# Patient Record
Sex: Male | Born: 1960 | Race: Black or African American | Hispanic: No | Smoking: Current some day smoker
Health system: Southern US, Community
[De-identification: ages and names within clinical notes are randomized; demographics above are authoritative.]

## PROBLEM LIST (undated history)

## (undated) DIAGNOSIS — C801 Malignant (primary) neoplasm, unspecified: Secondary | ICD-10-CM

## (undated) DIAGNOSIS — I10 Essential (primary) hypertension: Secondary | ICD-10-CM

## (undated) HISTORY — PX: ROTATOR CUFF REPAIR: SHX139

---

## 2018-06-07 ENCOUNTER — Other Ambulatory Visit: Payer: Self-pay

## 2018-06-07 ENCOUNTER — Emergency Department: Payer: PRIVATE HEALTH INSURANCE

## 2018-06-07 ENCOUNTER — Observation Stay
Admission: EM | Admit: 2018-06-07 | Discharge: 2018-06-09 | Disposition: A | Discharge status: Home / Self Care | Payer: PRIVATE HEALTH INSURANCE | Attending: Specialist | Admitting: Specialist

## 2018-06-07 DIAGNOSIS — R200 Anesthesia of skin: Secondary | ICD-10-CM | POA: Diagnosis not present

## 2018-06-07 DIAGNOSIS — Z880 Allergy status to penicillin: Secondary | ICD-10-CM | POA: Diagnosis not present

## 2018-06-07 DIAGNOSIS — I160 Hypertensive urgency: Secondary | ICD-10-CM | POA: Insufficient documentation

## 2018-06-07 DIAGNOSIS — S2232XA Fracture of one rib, left side, initial encounter for closed fracture: Secondary | ICD-10-CM | POA: Diagnosis not present

## 2018-06-07 DIAGNOSIS — Z8249 Family history of ischemic heart disease and other diseases of the circulatory system: Secondary | ICD-10-CM | POA: Diagnosis not present

## 2018-06-07 DIAGNOSIS — Z716 Tobacco abuse counseling: Secondary | ICD-10-CM | POA: Insufficient documentation

## 2018-06-07 DIAGNOSIS — F17210 Nicotine dependence, cigarettes, uncomplicated: Secondary | ICD-10-CM | POA: Diagnosis not present

## 2018-06-07 DIAGNOSIS — M79601 Pain in right arm: Secondary | ICD-10-CM | POA: Diagnosis not present

## 2018-06-07 DIAGNOSIS — R0789 Other chest pain: Principal | ICD-10-CM | POA: Insufficient documentation

## 2018-06-07 DIAGNOSIS — X58XXXA Exposure to other specified factors, initial encounter: Secondary | ICD-10-CM | POA: Diagnosis not present

## 2018-06-07 DIAGNOSIS — R911 Solitary pulmonary nodule: Secondary | ICD-10-CM | POA: Insufficient documentation

## 2018-06-07 DIAGNOSIS — I10 Essential (primary) hypertension: Secondary | ICD-10-CM | POA: Insufficient documentation

## 2018-06-07 DIAGNOSIS — J439 Emphysema, unspecified: Secondary | ICD-10-CM | POA: Diagnosis not present

## 2018-06-07 DIAGNOSIS — R079 Chest pain, unspecified: Secondary | ICD-10-CM | POA: Diagnosis present

## 2018-06-07 DIAGNOSIS — F1010 Alcohol abuse, uncomplicated: Secondary | ICD-10-CM | POA: Insufficient documentation

## 2018-06-07 HISTORY — DX: Essential (primary) hypertension: I10

## 2018-06-07 HISTORY — DX: Malignant (primary) neoplasm, unspecified: C80.1

## 2018-06-07 LAB — COMPREHENSIVE METABOLIC PANEL
ALT: 44 U/L (ref 0–44)
AST: 44 U/L — ABNORMAL HIGH (ref 15–41)
Albumin: 4 g/dL (ref 3.5–5.0)
Alkaline Phosphatase: 89 U/L (ref 38–126)
Anion gap: 10 (ref 5–15)
BUN: 21 mg/dL — ABNORMAL HIGH (ref 6–20)
CO2: 23 mmol/L (ref 22–32)
Calcium: 8.8 mg/dL — ABNORMAL LOW (ref 8.9–10.3)
Chloride: 107 mmol/L (ref 98–111)
Creatinine, Ser: 0.83 mg/dL (ref 0.61–1.24)
GFR calc Af Amer: >60 mL/min (ref >60)
GFR calc non Af Amer: >60 mL/min (ref >60)
Glucose, Bld: 93 mg/dL (ref 70–99)
Potassium: 4.4 mmol/L (ref 3.5–5.1)
Sodium: 140 mmol/L (ref 135–145)
Total Bilirubin: 0.7 mg/dL (ref 0.3–1.2)
Total Protein: 7.3 g/dL (ref 6.5–8.1)

## 2018-06-07 LAB — APTT: aPTT: 29 seconds (ref 24–36)

## 2018-06-07 LAB — CBC WITH DIFFERENTIAL/PLATELET
Abs Immature Granulocytes: 0.02 10*3/uL (ref 0.00–0.07)
Basophils Absolute: 0 10*3/uL (ref 0.0–0.1)
Basophils Relative: 0 %
Eosinophils Absolute: 0 10*3/uL (ref 0.0–0.5)
Eosinophils Relative: 1 %
HCT: 41.3 % (ref 39.0–52.0)
Hemoglobin: 14.2 g/dL (ref 13.0–17.0)
Immature Granulocytes: 0 %
Lymphocytes Relative: 45 %
Lymphs Abs: 2.9 10*3/uL (ref 0.7–4.0)
MCH: 32.3 pg (ref 26.0–34.0)
MCHC: 34.4 g/dL (ref 30.0–36.0)
MCV: 94.1 fL (ref 80.0–100.0)
Monocytes Absolute: 0.7 10*3/uL (ref 0.1–1.0)
Monocytes Relative: 10 %
Neutro Abs: 2.9 10*3/uL (ref 1.7–7.7)
Neutrophils Relative %: 44 %
Platelets: 288 10*3/uL (ref 150–400)
RBC: 4.39 MIL/uL (ref 4.22–5.81)
RDW: 14.9 % (ref 11.5–15.5)
WBC: 6.5 10*3/uL (ref 4.0–10.5)
nRBC: 0 % (ref 0.0–0.2)

## 2018-06-07 LAB — TROPONIN I
Troponin I: <0.03 ng/mL (ref <0.03)
Troponin I: 0.83 ng/mL (ref <0.03)
Troponin I: 0.82 ng/mL (ref <0.03)

## 2018-06-07 LAB — PROTIME-INR
INR: 1 (ref 0.8–1.2)
Prothrombin Time: 13.2 seconds (ref 11.4–15.2)

## 2018-06-07 LAB — LIPASE, BLOOD: Lipase: 35 U/L (ref 11–51)

## 2018-06-07 LAB — ETHANOL: Alcohol, Ethyl (B): <10 mg/dL (ref <10)

## 2018-06-07 MED ORDER — ADULT MULTIVITAMIN W/MINERALS CH
1.0000 | ORAL_TABLET | Freq: Every day | ORAL | Status: DC
  Administered 2018-06-07 – 2018-06-09 (×3): 1 via ORAL
  Filled 2018-06-07 (×3): qty 1

## 2018-06-07 MED ORDER — HEPARIN (PORCINE) 25000 UT/250ML-% IV SOLN
900.0000 [IU]/h | INTRAVENOUS | Status: DC
  Administered 2018-06-07: 700 [IU]/h via INTRAVENOUS
  Filled 2018-06-07: qty 250

## 2018-06-07 MED ORDER — NITROGLYCERIN 0.4 MG SL SUBL: 0.4000 mg | SUBLINGUAL_TABLET | SUBLINGUAL | Status: DC | PRN

## 2018-06-07 MED ORDER — HEPARIN BOLUS VIA INFUSION
3700.0000 [IU] | Freq: Once | INTRAVENOUS | Status: AC
  Administered 2018-06-07: 3700 [IU] via INTRAVENOUS
  Filled 2018-06-07: qty 3700

## 2018-06-07 MED ORDER — SODIUM CHLORIDE 0.9% FLUSH
3.0000 mL | Freq: Two times a day (BID) | INTRAVENOUS | Status: DC
  Administered 2018-06-07 – 2018-06-09 (×3): 3 mL via INTRAVENOUS

## 2018-06-07 MED ORDER — VITAMIN B-1 100 MG PO TABS
100.0000 mg | ORAL_TABLET | Freq: Every day | ORAL | Status: DC
  Administered 2018-06-07 – 2018-06-09 (×3): 100 mg via ORAL
  Filled 2018-06-07 (×3): qty 1

## 2018-06-07 MED ORDER — HYDRALAZINE HCL 20 MG/ML IJ SOLN
10.0000 mg | Freq: Four times a day (QID) | INTRAMUSCULAR | Status: DC | PRN
  Administered 2018-06-07 – 2018-06-08 (×2): 10 mg via INTRAVENOUS
  Filled 2018-06-07 (×2): qty 1

## 2018-06-07 MED ORDER — FOLIC ACID 1 MG PO TABS
1.0000 mg | ORAL_TABLET | Freq: Every day | ORAL | Status: DC
  Administered 2018-06-07 – 2018-06-09 (×3): 1 mg via ORAL
  Filled 2018-06-07 (×3): qty 1

## 2018-06-07 MED ORDER — ASPIRIN EC 81 MG PO TBEC
81.0000 mg | DELAYED_RELEASE_TABLET | Freq: Every day | ORAL | Status: DC
  Administered 2018-06-08 – 2018-06-09 (×2): 81 mg via ORAL
  Filled 2018-06-07 (×2): qty 1

## 2018-06-07 MED ORDER — ENOXAPARIN SODIUM 40 MG/0.4ML ~~LOC~~ SOLN: 40.0000 mg | SUBCUTANEOUS | Status: DC

## 2018-06-07 MED ORDER — HYDROCHLOROTHIAZIDE 12.5 MG PO CAPS
12.5000 mg | ORAL_CAPSULE | Freq: Every day | ORAL | Status: DC
  Administered 2018-06-07 – 2018-06-09 (×3): 12.5 mg via ORAL
  Filled 2018-06-07 (×3): qty 1

## 2018-06-07 MED ORDER — SODIUM CHLORIDE 0.9% FLUSH: 3.0000 mL | INTRAVENOUS | Status: DC | PRN

## 2018-06-07 MED ORDER — IOPAMIDOL (ISOVUE-370) INJECTION 76%
100.0000 mL | Freq: Once | INTRAVENOUS | Status: AC | PRN
  Administered 2018-06-07: 100 mL via INTRAVENOUS

## 2018-06-07 MED ORDER — IOHEXOL 350 MG/ML SOLN: 100.0000 mL | Freq: Once | INTRAVENOUS | Status: DC | PRN

## 2018-06-07 MED ORDER — LORAZEPAM 2 MG/ML IJ SOLN
1.0000 mg | Freq: Once | INTRAMUSCULAR | Status: AC
  Administered 2018-06-07: 1 mg via INTRAVENOUS
  Filled 2018-06-07: qty 1

## 2018-06-07 MED ORDER — AMLODIPINE BESYLATE 5 MG PO TABS
5.0000 mg | ORAL_TABLET | Freq: Every day | ORAL | Status: DC
  Administered 2018-06-07 – 2018-06-09 (×3): 5 mg via ORAL
  Filled 2018-06-07 (×3): qty 1

## 2018-06-07 NOTE — Consult Note (Signed)
[place] for Heparin dosing  Indication: chest pain/ACS   Allergies  Allergen Reactions  . Penicillins Hives    Did it involve swelling of the face/tongue/throat, SOB, or low BP? No Did it involve sudden or severe rash/hives, skin peeling, or any reaction on the inside of your mouth or nose? No Did you need to seek medical attention at a hospital or doctor's office? No When did it last happen? If all above answers are "NO", may proceed with cephalosporin use.     Patient Measurements: Height: 5\' 8"  (172.7 cm) Weight: 136 lb 12.8 oz (62.1 kg) IBW/kg (Calculated) : 68.4 Heparin Dosing Weight: 62.1  Kg   Vital Signs: Temp: 98.5 F (36.9 C) (05/02 1939) Temp Source: Oral (05/02 1939) BP: 178/104 (05/02 1939) Pulse Rate: 83 (05/02 1939)  Labs: Recent Labs    06/07/18 1306 06/07/18 1816 06/07/18 1926  HGB 14.2  --   --   HCT 41.3  --   --   PLT 288  --   --   APTT  --   --  29  LABPROT 13.2  --   --   INR 1.0  --   --   CREATININE 0.83  --   --   TROPONINI <0.03 0.83*  --     Estimated Creatinine Clearance: 85.2 mL/min (by C-G formula based on SCr of 0.83 mg/dL).   Medications:  Med reconciliation completed and no meds recorded. Enoxaparin was ordered, but was not been given.   Assessment:  Patient presented with CP and noted to have NSTEMI. He was given aspirin in the ED.   Troponin <0.03 >> 0.83   Goal of Therapy:  Heparin level 0.3-0.7 units/ml Monitor platelets by anticoagulation protocol: Yes   Plan:  Baseline labs have been ordered by MD. Will need to obtain PTT.  Heparin DW: 62.1 kg  Give 3700 units bolus x 1 Start heparin infusion at 700 units/hr (used 12 units/kg/hr) Check anti-Xa level in 6 hours and daily while on heparin, per protocol Continue to monitor H&H and platelets   Rowland Lathe, PharmD 06/07/2018,7:55 PM

## 2018-06-07 NOTE — Progress Notes (Signed)
Admitted from the ED for evaluation of CP.  Myoview ordered for the am.  Second Troponin was 0.83.  Awaiting orders from Dr. Stark Jock.

## 2018-06-07 NOTE — ED Notes (Signed)
Pt denies CP at this time. Pt A/Ox4. NAD noted at this time. This RN will continue to monitor pt.

## 2018-06-07 NOTE — ED Notes (Signed)
Pt denies headache at this time.

## 2018-06-07 NOTE — H&P (Addendum)
[place] [facility] [place] NAME: Darrell [name]    MR#:  [id number]  DATE OF BIRTH:  1960-12-06  DATE OF ADMISSION:  06/07/2018  PRIMARY CARE PHYSICIAN: Patient, No Pcp Per   REQUESTING/REFERRING PHYSICIAN: Charlotte Crumb  CHIEF COMPLAINT:   Chief Complaint  Patient presents with  . Chest Pain  . Hypertension    HISTORY OF PRESENT ILLNESS:  Darrell [name]  is a 58 y.o. male with a known history of daily alcohol abuse and tobacco abuse who has not seen a physician for more than 5 years now and not on any blood pressure medications.presented to the emergency room with complaints of chest pains and found to have elevated blood pressures.  Chest pain located in the left side of the chest.  Radiating towards the right arm with associated numbness.  Severity was about 10/10 on presentation.  Had some mild associated shortness of breath.  Was given aspirin.  At the time of my evaluation chest pain had resolved.  Initial blood pressure on presentation was 177/102.  Initial cardiac enzymes negative.  Twelve-lead EKG with no evidence of STEMI.  Patient had CT chest and abdomen with and without contrast done which was negative for aortic dissection.  Left posterior 12th rib fracture with mild displacement.  Emphysema with 7 mm right lower lobe pulmonary nodule noted.  Recommendation is for noncontrast CT repeated in 6 to 12 months from now.  Medical service called to admit patient for further evaluation.  PAST MEDICAL HISTORY:   Past Medical History:  Diagnosis Date  . Cancer ([place])   . Hypertension     PAST SURGICAL HISTORY:   Past Surgical History:  Procedure Laterality Date  . ROTATOR CUFF REPAIR      SOCIAL HISTORY:   Social History   Tobacco Use  . Smoking status: Current Every Day Smoker  Substance Use Topics  . Alcohol use: Yes    FAMILY HISTORY:  History reviewed. No pertinent family history.  DRUG ALLERGIES:   Allergies   Allergen Reactions  . Penicillins Hives    Did it involve swelling of the face/tongue/throat, SOB, or low BP? No Did it involve sudden or severe rash/hives, skin peeling, or any reaction on the inside of your mouth or nose? No Did you need to seek medical attention at a hospital or doctor's office? No When did it last happen? If all above answers are "NO", may proceed with cephalosporin use.     REVIEW OF SYSTEMS:   Review of Systems  Constitutional: Negative for chills and fever.  HENT: Negative for hearing loss and tinnitus.   Eyes: Negative for blurred vision and double vision.  Respiratory: Negative for cough, sputum production and shortness of breath.   Cardiovascular: Positive for chest pain. Negative for leg swelling.  Gastrointestinal: Negative for heartburn and nausea.  Genitourinary: Negative for dysuria and urgency.  Musculoskeletal: Negative for myalgias and neck pain.  Skin: Negative for itching and rash.  Neurological: Negative for dizziness and headaches.  Psychiatric/Behavioral: Negative for depression and hallucinations.    MEDICATIONS AT HOME:   Prior to Admission medications   Not on File      VITAL SIGNS:  Blood pressure (!) 178/104, pulse 78, temperature 98 F (36.7 C), temperature source Oral, resp. rate (!) 26, height 5\' 8"  (1.727 m), weight 65.8 kg, SpO2 100 %.  PHYSICAL EXAMINATION:  Physical Exam  GENERAL:  58 y.o.-year-old patient lying in the bed with no acute  distress.  EYES: Pupils equal, round, reactive to light and accommodation. No scleral icterus. Extraocular muscles intact.  HEENT: Head atraumatic, normocephalic. Oropharynx and nasopharynx clear.  NECK:  Supple, no jugular venous distention. No thyroid enlargement, no tenderness.  LUNGS: Normal breath sounds bilaterally, no wheezing, rales,rhonchi or crepitation. No use of accessory muscles of respiration.  CARDIOVASCULAR: S1, S2 normal. No murmurs, rubs, or gallops.  ABDOMEN:  Soft, nontender, nondistended. Bowel sounds present. No organomegaly or mass.  EXTREMITIES: No pedal edema, cyanosis, or clubbing.  NEUROLOGIC: Cranial nerves II through XII are intact. Muscle strength 5/5 in all extremities. Sensation intact. Gait not checked.  PSYCHIATRIC: The patient is alert and oriented x 3.  SKIN: No obvious rash, lesion, or ulcer.   LABORATORY PANEL:   CBC Recent Labs  Lab 06/07/18 1306  WBC 6.5  HGB 14.2  HCT 41.3  PLT 288   ------------------------------------------------------------------------------------------------------------------  Chemistries  Recent Labs  Lab 06/07/18 1306  NA 140  K 4.4  CL 107  CO2 23  GLUCOSE 93  BUN 21*  CREATININE 0.83  CALCIUM 8.8*  AST 44*  ALT 44  ALKPHOS 89  BILITOT 0.7   ------------------------------------------------------------------------------------------------------------------  Cardiac Enzymes Recent Labs  Lab 06/07/18 1306  TROPONINI <0.03   ------------------------------------------------------------------------------------------------------------------  RADIOLOGY:  Dg Chest Port 1 View  Result Date: 06/07/2018 CLINICAL DATA:  Pt presents today complaining of right-sided chest pain which radiated down his right arm, began shortly before arrival at rest. Pt has states prior history of cancer but not for the last several years. EXAM: PORTABLE CHEST 1 VIEW COMPARISON:  None. FINDINGS: There is mild bilateral interstitial thickening. There is no focal parenchymal opacity. There is no pleural effusion or pneumothorax. The heart and mediastinal contours are unremarkable. The osseous structures are unremarkable. IMPRESSION: No acute cardiopulmonary disease. Electronically Signed   By: Kathreen Devoid   On: 06/07/2018 13:24   Ct Angio Chest/abd/pel For Dissection W And/or Wo Contrast  Result Date: 06/07/2018 CLINICAL DATA:  Right-sided chest pain extending down the right arm EXAM: CT ANGIOGRAPHY CHEST,  ABDOMEN AND PELVIS TECHNIQUE: Multidetector CT imaging through the chest, abdomen and pelvis was performed using the standard protocol during bolus administration of intravenous contrast. Multiplanar reconstructed images and MIPs were obtained and reviewed to evaluate the vascular anatomy. CONTRAST:  114mL ISOVUE-370 IOPAMIDOL (ISOVUE-370) INJECTION 76% COMPARISON:  Chest x-ray from earlier today FINDINGS: CTA CHEST FINDINGS Cardiovascular: Normal heart size. No pericardial effusion. Noncontrast phase shows no intramural hematoma within the aorta. Postcontrast imaging shows no dissection. No aortic aneurysm or calcification. There is limited opacification of the pulmonary arteries with no central filling defect. Mediastinum/Nodes: Negative for adenopathy Lungs/Pleura: Emphysema. There is a smoothly contoured right middle lobe nodule measuring 7 mm average diameter. There is no edema, consolidation, effusion, or pneumothorax. Musculoskeletal: Degenerative endplate spurring. Displaced posterior left twelfth rib fracture that appears recent. Review of the MIP images confirms the above findings. CTA ABDOMEN AND PELVIS FINDINGS VASCULAR Aorta: Atherosclerotic plaque.  No dissection or aneurysm. Celiac: Truncated appearance of the celiac axis with right hepatic artery flow seemingly from pancreatic arcade collaterals and the left hepatic arising from the gastric. This has a chronic appearance. SMA: Widely patent. Renals: Single bilateral renal arteries.  No stenosis or occlusion. IMA: Diffusely patent. Inflow: Atherosclerotic plaque which is irregular at the level of the distal left common iliac. No flow limiting stenosis or aneurysm. Veins: Unremarkable in the arterial phase Review of the MIP images confirms the above findings. NON-VASCULAR Hepatobiliary:  No focal liver abnormality.No evidence of biliary obstruction or stone. Pancreas: Unremarkable. Spleen: Unremarkable. Adrenals/Urinary Tract: Negative adrenals. No  hydronephrosis or stone. Patchy bilateral renal cortical scarring. Unremarkable bladder. Stomach/Bowel: No obstruction. No appendicitis. Left colonic diverticulosis. Lymphatic: No mass or adenopathy. Reproductive:Negative Other: No ascites or pneumoperitoneum. Musculoskeletal: No acute abnormalities. Review of the MIP images confirms the above findings. IMPRESSION: 1. Negative for aortic dissection or other acute vascular finding. 2. Left posterior twelfth rib fracture with mild displacement. 3. Emphysema with 7 mm right lower lobe pulmonary nodule. Non-contrast chest CT at 6-12 months is recommended. If the nodule is stable at time of repeat CT, then future CT at 18-24 months (from today's scan) is considered optional for low-risk patients, but is recommended for high-risk patients. This recommendation follows the consensus statement: Guidelines for Management of Incidental Pulmonary Nodules Detected on CT Images: From the Fleischner Society 2017; Radiology 2017; 284:228-243. 4. Atherosclerosis with notable irregular plaque at the left common iliac artery. Electronically Signed   By: Monte Fantasia M.D.   On: 06/07/2018 14:46      IMPRESSION AND PLAN:  Patient is a 58 year old African-American male admitted with complaints of chest pain.  Found to have hypertensive urgency.  Has not seen any physician for the last 5 years.  Not on any medications.  1. Non-ST MI elevation Patient presented with chest pain which was radiating to the right arm.  Resolved already. Follow-up serial cardiac enzymes to rule out acute coronary syndrome. Requested for cardiology consult.  Placed on aspirin and as needed nitroglycerin I was later notified about a bump in troponin to 0.83. Was initially planning for stress test which was canceled.  Initiated heparin drip.  Sent a message to cardiologist on call; Dr. Ubaldo Glassing  2.  Hypertensive urgency Patient currently not on any blood pressure medication.  Has not seen a  physician for years. Placed on Norvasc and hydrochlorothiazide. IV hydralazine as needed for systolic blood pressure greater than 160.  Monitor and adjust meds as needed.  3.  History of alcohol abuse Drinks at least 2-3 beers every day. No evidence of alcohol intoxication at this time.  Monitor for withdrawal.  Placed on alcohol withdrawal protocol.  Placed on thiamine, multivitamin and folic acid.  4.  Incidental finding of 7 mm right lower lobe lung lesion Recommendation is to repeat CT without contrast in 6 to 12 months. Patient should be set up to follow-up with new primary care physician and possibly pulmonologist for follow-up on these on discharge.  5.  Tobacco abuse Patient smokes less than half a pack of cigarettes per day.  Smoking cessation counseling done  DVT prophylaxis; Lovenox   All the records are reviewed and case discussed with ED provider. Management plans discussed with the patient, family and they are in agreement.  CODE STATUS: Full code  TOTAL TIME TAKING CARE OF THIS PATIENT: 53 minutes.    Eldora Napp M.D on 06/07/2018 at 4:55 PM  Between 7am to 6pm - Pager - [phone]  After 6pm go to [email or web address] - Proofreader  [facility]  Office  [phone]  CC: Primary care physician; Patient, No Pcp Per   Note: This dictation was prepared with Dragon dictation along with smaller phrase technology. Any transcriptional errors that result from this process are unintentional.

## 2018-06-07 NOTE — ED Provider Notes (Addendum)
[facility] Emergency Department Provider Note  ____________________________________________   I have reviewed the triage vital signs and the nursing notes. Where available I have reviewed prior notes and, if possible and indicated, outside hospital notes.    HISTORY  Chief Complaint Chest Pain and Hypertension    HPI Darrell [name] is a 58 y.o. male with a prior history states of cancer but not for the last several years, history of EtOH abuse, states he drinks liquor and beer every night, states his last liquor and beer was last night.  No known history of elevated blood pressure does not been to a doctor in "years" presents today complaining of right-sided chest pain which radiated down his right arm, began shortly before arrival at rest, he was eating lunch.  No abdominal pain did not tear towards the back or have any back radiation at all.  Pain initially for EMS was more impressive but now it was a 2 out of 10.  They did give him aspirin.  They found him to be somewhat hypertensive and anxious.  He has not had any abdominal pain, no numbness or weakness, no antecedent exertional discomfort has not had pain like this before, states it was somewhat sharp.  Did not get sweaty.  It was not pleuritic.  Dates is "nearly gone now".  Did not give me a number.  "Maybe 1" I did talk to EMS, they report that his twelve-lead was reassuring aside from hypertension of unknown duration or acuity, his vital signs are reassuring as well.  Did receive aspirin on the way in.    Past Medical History:  Diagnosis Date  . Cancer ([place])   . Hypertension     There are no active problems to display for this patient.   Past Surgical History:  Procedure Laterality Date  . ROTATOR CUFF REPAIR      Prior to Admission medications   Not on File    Allergies Penicillins  History reviewed. No pertinent family history.  Social History Social History   Tobacco Use  . Smoking  status: Current Every Day Smoker  Substance Use Topics  . Alcohol use: Yes  . Drug use: Not on file    Review of Systems Constitutional: No fever/chills Eyes: No visual changes. ENT: No sore throat. No stiff neck no neck pain Cardiovascular: + chest pain. Respiratory: Denies shortness of breath. Gastrointestinal:   no vomiting.  No diarrhea.  No constipation. Genitourinary: Negative for dysuria. Musculoskeletal: Negative lower extremity swelling Skin: Negative for rash. Neurological: Negative for severe headaches, focal weakness or numbness.   ____________________________________________   PHYSICAL EXAM:  VITAL SIGNS: ED Triage Vitals  Enc Vitals Group     BP 06/07/18 1306 (!) 177/102     Pulse Rate 06/07/18 1306 83     Resp 06/07/18 1306 12     Temp 06/07/18 1306 98 F (36.7 C)     Temp Source 06/07/18 1306 Oral     SpO2 06/07/18 1306 98 %     Weight 06/07/18 1304 145 lb (65.8 kg)     Height 06/07/18 1304 5\' 8"  (1.727 m)     Head Circumference --      Peak Flow --      Pain Score 06/07/18 1303 2     Pain Loc --      Pain Edu? --      Excl. in [place]? --     Constitutional: Alert and oriented. Well appearing and in no acute  distress. Eyes: Conjunctivae are normal Head: Atraumatic HEENT: No congestion/rhinnorhea. Mucous membranes are moist.  Oropharynx non-erythematous Neck:   Nontender with no meningismus, no masses, no stridor chest: Some mild tenderness to the right pectoralis region which seems to evoke some of his discomfort.  No lesions no flail chest no crepitus Cardiovascular: Normal rate, regular rhythm. Grossly normal heart sounds.  Good peripheral circulation. Respiratory: Normal respiratory effort.  No retractions. Lungs CTAB. Abdominal: Soft and nontender. No distention. No guarding no rebound Back:  There is no focal tenderness or step off.  there is no midline tenderness there are no lesions noted. there is no CVA tenderness Musculoskeletal: No lower  extremity tenderness, no upper extremity tenderness. No joint effusions, no DVT signs strong distal pulses no edema Neurologic:  Normal speech and language. No gross focal neurologic deficits are appreciated.  Skin:  Skin is warm, dry and intact. No rash noted. Psychiatric: Mood and affect are little bit anxious. Speech and behavior are normal.  ____________________________________________   LABS (all labs ordered are listed, but only abnormal results are displayed)  Labs Reviewed  CBC WITH DIFFERENTIAL/PLATELET  PROTIME-INR  COMPREHENSIVE METABOLIC PANEL  ETHANOL  LIPASE, BLOOD  TROPONIN I    Pertinent labs  results that were available during my care of the patient were reviewed by me and considered in my medical decision making (see chart for details). ____________________________________________  EKG  I personally interpreted any EKGs ordered by me or triage Sinus rhythm rate 82, no acute ST elevation or depression normal axis unremarkable EKG ____________________________________________  RADIOLOGY  Pertinent labs & imaging results that were available during my care of the patient were reviewed by me and considered in my medical decision making (see chart for details). If possible, patient and/or family made aware of any abnormal findings.  No results found. ____________________________________________    PROCEDURES  Procedure(s) performed: None  Procedures  Critical Care performed: None  ____________________________________________   INITIAL IMPRESSION / ASSESSMENT AND PLAN / ED COURSE  Pertinent labs & imaging results that were available during my care of the patient were reviewed by me and considered in my medical decision making (see chart for details).  Here with chest pain, right side at rest and also hypertension.  Hypertension in a patient who drinks as much as he does provokes concern for possible early withdrawal, we will give him a little Ativan to  see if that helps.  I do not know what his baseline pressure is and neither does he so I do not wish to be aggressively lowering his blood pressure as long as he is relatively asymptomatic which he appears to be.  Differential includes referred abdominal discomfort but he is having no abdominal discomfort at this time, musculoskeletal pain, PE dissection and ACS.  We will investigate these things based on their probability of recurrence.  Given the absence of shortness of breath and nonpleuritic nature of the discomfort, I have low suspicion for PE although with a remote history of cancer it certainly is not impossible, in addition, dissection is certainly possible does not seem to be quite the clinical picture given that there is no radiation to the back ACS certainly could cause symptoms of this variety especially given possibly chronic untreated hypertension, and we will continue to evaluate him in the usual means escalating diagnostic intervention as appropriate.  Note, patient does not know what kind of cancer he had, he states it is "in the computer" but I do not see any records  in the computer.  ----------------------------------------- 1:32 PM on 06/07/2018 -----------------------------------------  States pain free.  ----------------------------------------- 3:14 PM on 06/07/2018 -----------------------------------------  Blood pressure down to 160s at this time, did discuss with the hospitalist, they will admit patient for chest pain evaluation, I did also mention to the hospitalist the need for discharge instructions to the patient mentioning his lung nodules.  Patient consents to admission, he is in no acute distress at this time.  I do not see any reason for me to acutely change his blood pressure as I do not know his baseline and I could cause more harm than good.  CT scan did not show any dissection fortunately, nothing to suggest PE either.  Nor is there any clear evidence of  intra-abdominal pathology but I do think given his hypertension, tobacco abuse, significant risk factors for CAD, atherosclerosis seen on CT scan, and atypical chest pain he should be brought in for evaluation and he and the hospitalist agree.    ____________________________________________   FINAL CLINICAL IMPRESSION(S) / ED DIAGNOSES  Final diagnoses:  Chest pain      This chart was dictated using voice recognition software.  Despite best efforts to proofread,  errors can occur which can change meaning.      Schuyler Amor, MD 06/07/18 1313    Schuyler Amor, MD 06/07/18 1314    Schuyler Amor, MD 06/07/18 1320    Schuyler Amor, MD 06/07/18 1332    Schuyler Amor, MD 06/07/18 1515

## 2018-06-07 NOTE — Consult Note (Signed)
[place] for Heparin dosing  Indication: chest pain/ACS   Allergies  Allergen Reactions  . Penicillins Hives    Did it involve swelling of the face/tongue/throat, SOB, or low BP? No Did it involve sudden or severe rash/hives, skin peeling, or any reaction on the inside of your mouth or nose? No Did you need to seek medical attention at a hospital or doctor's office? No When did it last happen? If all above answers are "NO", may proceed with cephalosporin use.     Patient Measurements: Height: 5\' 8"  (172.7 cm) Weight: 136 lb 12.8 oz (62.1 kg) IBW/kg (Calculated) : 68.4 Heparin Dosing Weight: 62.1  Kg   Vital Signs: Temp: 97.7 F (36.5 C) (05/02 1823) Temp Source: Oral (05/02 1823) BP: 187/110 (05/02 1823) Pulse Rate: 81 (05/02 1823)  Labs: Recent Labs    06/07/18 1306 06/07/18 1816  HGB 14.2  --   HCT 41.3  --   PLT 288  --   LABPROT 13.2  --   INR 1.0  --   CREATININE 0.83  --   TROPONINI <0.03 0.83*    Estimated Creatinine Clearance: 85.2 mL/min (by C-G formula based on SCr of 0.83 mg/dL).   Medications:  Med reconciliation completed and no meds recorded. Enoxaparin was ordered, but has not been given.   Assessment:  Patient presented with CP and noted to have NSTEMI.    Troponin <0.03 >> 0.83   Goal of Therapy:  Heparin level 0.3-0.7 units/ml Monitor platelets by anticoagulation protocol: Yes   Plan:  Baseline labs have been ordered by MD. Will need to obtain PTT.  Heparin DW: 62.1 kg  Give 3700 units bolus x 1 Start heparin infusion at 700 units/hr (used 12 units/kg/hr) Check anti-Xa level in 6 hours and daily while on heparin, per protocol Continue to monitor H&H and platelets   Rowland Lathe, PharmD 06/07/2018,7:06 PM

## 2018-06-07 NOTE — ED Notes (Signed)
Pt states he had just finished lunch and was going back to work when R sided CP began. States he felt hot as well so took his shirt off to try to cool off. Denies increase in stress. Also c/o of pain going down R arm. Hx CA but unsure what kind of CA he had. Doesn't take any meds for BP.

## 2018-06-07 NOTE — ED Triage Notes (Signed)
Pt arrives [facility] for CP and HTN. States no cardiac hx or hx of taking BP meds. CBG 129. 210/120 with EMS. Pt admits to drinking alcohol each night. EMS gave 324 ASA enroute. A&O upon arrival.

## 2018-06-07 NOTE — Progress Notes (Signed)
CRITICAL VALUE ALERT  Critical Value:  Troponin 0.83 Date & Time Notied:  5-2- 20 1850  Provider Notified: Dr. Stark Jock  Orders Received/Actions taken:

## 2018-06-07 NOTE — ED Notes (Signed)
X-ray at bedside

## 2018-06-07 NOTE — ED Notes (Signed)
ED TO INPATIENT HANDOFF REPORT  ED Nurse Name and Phone #: Hampton Cost [phone]  S Name/Age/Gender Darrell [name] 58 y.o. male Room/Bed: ED09A/ED09A  Code Status   Code Status: Full Code  Home/SNF/Other Home A&Ox4 Is this baseline? Yes   Triage Complete: Triage complete  Chief Complaint cp  Triage Note Pt arrives [facility] for CP and HTN. States no cardiac hx or hx of taking BP meds. CBG 129. 210/120 with EMS. Pt admits to drinking alcohol each night. EMS gave 324 ASA enroute. A&O upon arrival.    Allergies Allergies  Allergen Reactions  . Penicillins Hives    Did it involve swelling of the face/tongue/throat, SOB, or low BP? No Did it involve sudden or severe rash/hives, skin peeling, or any reaction on the inside of your mouth or nose? No Did you need to seek medical attention at a hospital or doctor's office? No When did it last happen? If all above answers are "NO", may proceed with cephalosporin use.     Level of Care/Admitting Diagnosis ED Disposition    ED Disposition Condition [facility] Area: [place] [[id number]]  Level of Care: Telemetry [5]  Covid Evaluation: N/A  Diagnosis: Chest pain [629476]  Admitting Physician: Otila Back [[id number]]  Attending Physician: Otila Back [[id number]]  PT Class (Do Not Modify): Observation [104]  PT Acc Code (Do Not Modify): Observation [10022]       B Medical/Surgery History Past Medical History:  Diagnosis Date  . Cancer ([place])   . Hypertension    Past Surgical History:  Procedure Laterality Date  . ROTATOR CUFF REPAIR       A IV Location/Drains/Wounds Patient Lines/Drains/Airways Status   Active Line/Drains/Airways    Name:   Placement date:   Placement time:   Site:   Days:   Peripheral IV 06/07/18 Left Forearm   06/07/18    1309    Forearm   less than 1          Intake/Output Last 24 hours No intake or output data in the 24 hours ending 06/07/18 1654  Labs/Imaging Results  for orders placed or performed during the hospital encounter of 06/07/18 (from the past 48 hour(s))  CBC with Differential     Status: None   Collection Time: 06/07/18  1:06 PM  Result Value Ref Range   WBC 6.5 4.0 - 10.5 K/uL   RBC 4.39 4.22 - 5.81 MIL/uL   Hemoglobin 14.2 13.0 - 17.0 g/dL   HCT 41.3 39.0 - 52.0 %   MCV 94.1 80.0 - 100.0 fL   MCH 32.3 26.0 - 34.0 pg   MCHC 34.4 30.0 - 36.0 g/dL   RDW 14.9 11.5 - 15.5 %   Platelets 288 150 - 400 K/uL   nRBC 0.0 0.0 - 0.2 %   Neutrophils Relative % 44 %   Neutro Abs 2.9 1.7 - 7.7 K/uL   Lymphocytes Relative 45 %   Lymphs Abs 2.9 0.7 - 4.0 K/uL   Monocytes Relative 10 %   Monocytes Absolute 0.7 0.1 - 1.0 K/uL   Eosinophils Relative 1 %   Eosinophils Absolute 0.0 0.0 - 0.5 K/uL   Basophils Relative 0 %   Basophils Absolute 0.0 0.0 - 0.1 K/uL   Immature Granulocytes 0 %   Abs Immature Granulocytes 0.02 0.00 - 0.07 K/uL    Comment: Performed at [facility], [address] [place], [place] [address]  Protime-INR     Status: None  Collection Time: 06/07/18  1:06 PM  Result Value Ref Range   Prothrombin Time 13.2 11.4 - 15.2 seconds   INR 1.0 0.8 - 1.2    Comment: (NOTE) INR goal varies based on device and disease states. Performed at [facility], [address] [place], [place] [address]   Comprehensive metabolic panel     Status: Abnormal   Collection Time: 06/07/18  1:06 PM  Result Value Ref Range   Sodium 140 135 - 145 mmol/L   Potassium 4.4 3.5 - 5.1 mmol/L    Comment: HEMOLYSIS AT THIS LEVEL MAY AFFECT RESULT   Chloride 107 98 - 111 mmol/L   CO2 23 22 - 32 mmol/L   Glucose, Bld 93 70 - 99 mg/dL   BUN 21 (H) 6 - 20 mg/dL   Creatinine, Ser 0.83 0.61 - 1.24 mg/dL   Calcium 8.8 (L) 8.9 - 10.3 mg/dL   Total Protein 7.3 6.5 - 8.1 g/dL   Albumin 4.0 3.5 - 5.0 g/dL   AST 44 (H) 15 - 41 U/L   ALT 44 0 - 44 U/L   Alkaline Phosphatase 89 38 - 126 U/L   Total Bilirubin 0.7 0.3 - 1.2 mg/dL   GFR calc  non Af Amer >60 >60 mL/min   GFR calc Af Amer >60 >60 mL/min   Anion gap 10 5 - 15    Comment: Performed at [facility], [address] [place], [place] [address]  Ethanol     Status: None   Collection Time: 06/07/18  1:06 PM  Result Value Ref Range   Alcohol, Ethyl (B) <10 <10 mg/dL    Comment: (NOTE) Lowest detectable limit for serum alcohol is 10 mg/dL. For medical purposes only. Performed at [facility], [address] [place], [place] [address]   Lipase, blood     Status: None   Collection Time: 06/07/18  1:06 PM  Result Value Ref Range   Lipase 35 11 - 51 U/L    Comment: Performed at [facility], [address] [place], [place] [address]  Troponin I - Once     Status: None   Collection Time: 06/07/18  1:06 PM  Result Value Ref Range   Troponin I <0.03 <0.03 ng/mL    Comment: Performed at [facility], [address] [place], [place] [address]   Dg Chest Port 1 View  Result Date: 06/07/2018 CLINICAL DATA:  Pt presents today complaining of right-sided chest pain which radiated down his right arm, began shortly before arrival at rest. Pt has states prior history of cancer but not for the last several years. EXAM: PORTABLE CHEST 1 VIEW COMPARISON:  None. FINDINGS: There is mild bilateral interstitial thickening. There is no focal parenchymal opacity. There is no pleural effusion or pneumothorax. The heart and mediastinal contours are unremarkable. The osseous structures are unremarkable. IMPRESSION: No acute cardiopulmonary disease. Electronically Signed   By: Kathreen Devoid   On: 06/07/2018 13:24   Ct Angio Chest/abd/pel For Dissection W And/or Wo Contrast  Result Date: 06/07/2018 CLINICAL DATA:  Right-sided chest pain extending down the right arm EXAM: CT ANGIOGRAPHY CHEST, ABDOMEN AND PELVIS TECHNIQUE: Multidetector CT imaging through the chest, abdomen and pelvis was performed using the standard protocol during bolus administration of  intravenous contrast. Multiplanar reconstructed images and MIPs were obtained and reviewed to evaluate the vascular anatomy. CONTRAST:  139mL ISOVUE-370 IOPAMIDOL (ISOVUE-370) INJECTION 76% COMPARISON:  Chest x-ray from earlier today FINDINGS: CTA CHEST FINDINGS  Cardiovascular: Normal heart size. No pericardial effusion. Noncontrast phase shows no intramural hematoma within the aorta. Postcontrast imaging shows no dissection. No aortic aneurysm or calcification. There is limited opacification of the pulmonary arteries with no central filling defect. Mediastinum/Nodes: Negative for adenopathy Lungs/Pleura: Emphysema. There is a smoothly contoured right middle lobe nodule measuring 7 mm average diameter. There is no edema, consolidation, effusion, or pneumothorax. Musculoskeletal: Degenerative endplate spurring. Displaced posterior left twelfth rib fracture that appears recent. Review of the MIP images confirms the above findings. CTA ABDOMEN AND PELVIS FINDINGS VASCULAR Aorta: Atherosclerotic plaque.  No dissection or aneurysm. Celiac: Truncated appearance of the celiac axis with right hepatic artery flow seemingly from pancreatic arcade collaterals and the left hepatic arising from the gastric. This has a chronic appearance. SMA: Widely patent. Renals: Single bilateral renal arteries.  No stenosis or occlusion. IMA: Diffusely patent. Inflow: Atherosclerotic plaque which is irregular at the level of the distal left common iliac. No flow limiting stenosis or aneurysm. Veins: Unremarkable in the arterial phase Review of the MIP images confirms the above findings. NON-VASCULAR Hepatobiliary: No focal liver abnormality.No evidence of biliary obstruction or stone. Pancreas: Unremarkable. Spleen: Unremarkable. Adrenals/Urinary Tract: Negative adrenals. No hydronephrosis or stone. Patchy bilateral renal cortical scarring. Unremarkable bladder. Stomach/Bowel: No obstruction. No appendicitis. Left colonic diverticulosis.  Lymphatic: No mass or adenopathy. Reproductive:Negative Other: No ascites or pneumoperitoneum. Musculoskeletal: No acute abnormalities. Review of the MIP images confirms the above findings. IMPRESSION: 1. Negative for aortic dissection or other acute vascular finding. 2. Left posterior twelfth rib fracture with mild displacement. 3. Emphysema with 7 mm right lower lobe pulmonary nodule. Non-contrast chest CT at 6-12 months is recommended. If the nodule is stable at time of repeat CT, then future CT at 18-24 months (from today's scan) is considered optional for low-risk patients, but is recommended for high-risk patients. This recommendation follows the consensus statement: Guidelines for Management of Incidental Pulmonary Nodules Detected on CT Images: From the Fleischner Society 2017; Radiology 2017; 284:228-243. 4. Atherosclerosis with notable irregular plaque at the left common iliac artery. Electronically Signed   By: Monte Fantasia M.D.   On: 06/07/2018 14:46    Pending Labs Unresulted Labs (From admission, onward)    Start     Ordered   06/08/18 6789  Basic metabolic panel  Tomorrow morning,   STAT     06/07/18 1650   06/08/18 0500  CBC  Tomorrow morning,   STAT     06/07/18 1650   06/08/18 0500  Magnesium  Tomorrow morning,   STAT     06/07/18 1650   06/07/18 2000  Troponin I - Once  Once,   STAT     06/07/18 1650   06/07/18 1647  Troponin I - Once  Once,   STAT     06/07/18 1650   06/07/18 1642  HIV antibody (Routine Testing)  Once,   STAT     06/07/18 1644          Vitals/Pain Today's Vitals   06/07/18 1441 06/07/18 1500 06/07/18 1530 06/07/18 1600  BP: (!) 170/105 (!) 165/101 (!) 170/106 (!) 178/104  Pulse: 86 81 80 78  Resp: (!) 28 (!) 23 (!) 25 (!) 26  Temp:      TempSrc:      SpO2: 99% 98% 98% 100%  Weight:      Height:      PainSc:        Isolation Precautions No active isolations  Medications Medications  enoxaparin (LOVENOX) injection 40 mg (has no  administration in time range)  amLODipine (NORVASC) tablet 5 mg (has no administration in time range)  hydrochlorothiazide (MICROZIDE) capsule 12.5 mg (has no administration in time range)  hydrALAZINE (APRESOLINE) injection 10 mg (has no administration in time range)  thiamine (VITAMIN B-1) tablet 100 mg (has no administration in time range)  multivitamin (PROSIGHT) tablet 1 tablet (has no administration in time range)  folic acid (FOLVITE) tablet 1 mg (has no administration in time range)  LORazepam (ATIVAN) injection 1 mg (1 mg Intravenous Given 06/07/18 1315)  iopamidol (ISOVUE-370) 76 % injection 100 mL (100 mLs Intravenous Contrast Given 06/07/18 1415)    Mobility walks Low fall risk   Focused Assessments    R Recommendations: See Admitting Provider Note  Report given to:   Additional Notes: pt able to ambulate w/o assistance.

## 2018-06-08 LAB — CBC
HCT: 39.4 % (ref 39.0–52.0)
Hemoglobin: 13.7 g/dL (ref 13.0–17.0)
MCH: 32.2 pg (ref 26.0–34.0)
MCHC: 34.8 g/dL (ref 30.0–36.0)
MCV: 92.5 fL (ref 80.0–100.0)
Platelets: 284 10*3/uL (ref 150–400)
RBC: 4.26 MIL/uL (ref 4.22–5.81)
RDW: 14.7 % (ref 11.5–15.5)
WBC: 8.5 10*3/uL (ref 4.0–10.5)
nRBC: 0 % (ref 0.0–0.2)

## 2018-06-08 LAB — BASIC METABOLIC PANEL
Anion gap: 10 (ref 5–15)
BUN: 14 mg/dL (ref 6–20)
CO2: 23 mmol/L (ref 22–32)
Calcium: 9.1 mg/dL (ref 8.9–10.3)
Chloride: 105 mmol/L (ref 98–111)
Creatinine, Ser: 0.76 mg/dL (ref 0.61–1.24)
GFR calc Af Amer: >60 mL/min (ref >60)
GFR calc non Af Amer: >60 mL/min (ref >60)
Glucose, Bld: 98 mg/dL (ref 70–99)
Potassium: 3.5 mmol/L (ref 3.5–5.1)
Sodium: 138 mmol/L (ref 135–145)

## 2018-06-08 LAB — HEPARIN LEVEL (UNFRACTIONATED)
Heparin Unfractionated: 0.17 IU/mL — ABNORMAL LOW (ref 0.30–0.70)
Heparin Unfractionated: 0.34 IU/mL (ref 0.30–0.70)

## 2018-06-08 LAB — MAGNESIUM: Magnesium: 2 mg/dL (ref 1.7–2.4)

## 2018-06-08 MED ORDER — HEPARIN BOLUS VIA INFUSION
1800.0000 [IU] | Freq: Once | INTRAVENOUS | Status: AC
  Administered 2018-06-08: 1800 [IU] via INTRAVENOUS
  Filled 2018-06-08: qty 1800

## 2018-06-08 MED ORDER — ENOXAPARIN SODIUM 40 MG/0.4ML ~~LOC~~ SOLN
40.0000 mg | SUBCUTANEOUS | Status: DC
  Administered 2018-06-08: 40 mg via SUBCUTANEOUS
  Filled 2018-06-08: qty 0.4

## 2018-06-08 NOTE — Consult Note (Signed)
Cardiology Consultation Note    Patient ID: Darrell [name], MRN: [id number], DOB/AGE: October 21, 1960 58 y.o. Admit date: 06/07/2018   Date of Consult: 06/08/2018 Primary Physician: Patient, No Pcp Per Primary Cardiologist:  Chief Complaint:  Chest pain Reason for Consultation: chest pain Requesting MD: Dr. Verdell Carmine  HPI: Darrell [name] is a 58 y.o. male with history of apparent hypertension, and history of alcohol and tobacco abuse who has not seen a physician in some time who presented to the emergency room with complaints of right-sided chest pain and hypertension.  Patient states the pain occurred on the anterior part of the right side of his chest radiating to his arm.  There was some numbness with this.  On presentation to the emergency room he had a systolic blood pressure of 177.  There was no ischemic changes on his electrocardiogram.  Chest CT of the abdomen with and without contrast revealed no aortic dissection.  There was a left posterior 12th rib fracture with mild displacement as well as emphysema.  His initial troponin was 0.3 and increased to 0.83 and 0.82.  He has had no further chest pain since his blood pressure was improved.  He has been placed on heparin.  He denies any exertional pain.  He denied any excessive trauma or heavy lifting on the date prior to or on the day of admission.  Chest x-ray revealed no acute cardiopulmonary disease.  Past Medical History:  Diagnosis Date  . Cancer ([place])   . Hypertension       Surgical History:  Past Surgical History:  Procedure Laterality Date  . ROTATOR CUFF REPAIR       Home Meds: Prior to Admission medications   Not on File    Inpatient Medications:  . amLODipine  5 mg Oral Daily  . aspirin EC  81 mg Oral Daily  . enoxaparin (LOVENOX) injection  40 mg Subcutaneous Q24H  . folic acid  1 mg Oral Daily  . hydrochlorothiazide  12.5 mg Oral Daily  . multivitamin with minerals  1 tablet Oral Daily  . sodium chloride flush  3 mL  Intravenous Q12H  . thiamine  100 mg Oral Daily     Allergies:  Allergies  Allergen Reactions  . Penicillins Hives    Did it involve swelling of the face/tongue/throat, SOB, or low BP? No Did it involve sudden or severe rash/hives, skin peeling, or any reaction on the inside of your mouth or nose? No Did you need to seek medical attention at a hospital or doctor's office? No When did it last happen? If all above answers are "NO", may proceed with cephalosporin use.     Social History   Socioeconomic History  . Marital status: Legally Separated    Spouse name: Not on file  . Number of children: Not on file  . Years of education: Not on file  . Highest education level: Not on file  Occupational History  . Not on file  Social Needs  . Financial resource strain: Not on file  . Food insecurity:    Worry: Not on file    Inability: Not on file  . Transportation needs:    Medical: Not on file    Non-medical: Not on file  Tobacco Use  . Smoking status: Current Every Day Smoker  . Smokeless tobacco: Never Used  Substance and Sexual Activity  . Alcohol use: Yes  . Drug use: Not on file  . Sexual activity: Not on file  Lifestyle  .  Physical activity:    Days per week: Not on file    Minutes per session: Not on file  . Stress: Not on file  Relationships  . Social connections:    Talks on phone: Not on file    Gets together: Not on file    Attends religious service: Not on file    Active member of club or organization: Not on file    Attends meetings of clubs or organizations: Not on file    Relationship status: Not on file  . Intimate partner violence:    Fear of current or ex partner: Not on file    Emotionally abused: Not on file    Physically abused: Not on file    Forced sexual activity: Not on file  Other Topics Concern  . Not on file  Social History Narrative  . Not on file     History reviewed. No pertinent family history.   Review of Systems: A  12-system review of systems was performed and is negative except as noted in the HPI.  Labs: Recent Labs    06/07/18 1306 06/07/18 1816 06/07/18 1926  TROPONINI <0.03 0.83* 0.82*   Lab Results  Component Value Date   WBC 8.5 06/08/2018   HGB 13.7 06/08/2018   HCT 39.4 06/08/2018   MCV 92.5 06/08/2018   PLT 284 06/08/2018    Recent Labs  Lab 06/07/18 1306 06/08/18 0200  NA 140 138  K 4.4 3.5  CL 107 105  CO2 23 23  BUN 21* 14  CREATININE 0.83 0.76  CALCIUM 8.8* 9.1  PROT 7.3  --   BILITOT 0.7  --   ALKPHOS 89  --   ALT 44  --   AST 44*  --   GLUCOSE 93 98   No results found for: CHOL, HDL, LDLCALC, TRIG No results found for: DDIMER  Radiology/Studies:  Dg Chest Port 1 View  Result Date: 06/07/2018 CLINICAL DATA:  Pt presents today complaining of right-sided chest pain which radiated down his right arm, began shortly before arrival at rest. Pt has states prior history of cancer but not for the last several years. EXAM: PORTABLE CHEST 1 VIEW COMPARISON:  None. FINDINGS: There is mild bilateral interstitial thickening. There is no focal parenchymal opacity. There is no pleural effusion or pneumothorax. The heart and mediastinal contours are unremarkable. The osseous structures are unremarkable. IMPRESSION: No acute cardiopulmonary disease. Electronically Signed   By: Kathreen Devoid   On: 06/07/2018 13:24   Ct Angio Chest/abd/pel For Dissection W And/or Wo Contrast  Result Date: 06/07/2018 CLINICAL DATA:  Right-sided chest pain extending down the right arm EXAM: CT ANGIOGRAPHY CHEST, ABDOMEN AND PELVIS TECHNIQUE: Multidetector CT imaging through the chest, abdomen and pelvis was performed using the standard protocol during bolus administration of intravenous contrast. Multiplanar reconstructed images and MIPs were obtained and reviewed to evaluate the vascular anatomy. CONTRAST:  169mL ISOVUE-370 IOPAMIDOL (ISOVUE-370) INJECTION 76% COMPARISON:  Chest x-ray from earlier today  FINDINGS: CTA CHEST FINDINGS Cardiovascular: Normal heart size. No pericardial effusion. Noncontrast phase shows no intramural hematoma within the aorta. Postcontrast imaging shows no dissection. No aortic aneurysm or calcification. There is limited opacification of the pulmonary arteries with no central filling defect. Mediastinum/Nodes: Negative for adenopathy Lungs/Pleura: Emphysema. There is a smoothly contoured right middle lobe nodule measuring 7 mm average diameter. There is no edema, consolidation, effusion, or pneumothorax. Musculoskeletal: Degenerative endplate spurring. Displaced posterior left twelfth rib fracture that appears recent. Review of the  MIP images confirms the above findings. CTA ABDOMEN AND PELVIS FINDINGS VASCULAR Aorta: Atherosclerotic plaque.  No dissection or aneurysm. Celiac: Truncated appearance of the celiac axis with right hepatic artery flow seemingly from pancreatic arcade collaterals and the left hepatic arising from the gastric. This has a chronic appearance. SMA: Widely patent. Renals: Single bilateral renal arteries.  No stenosis or occlusion. IMA: Diffusely patent. Inflow: Atherosclerotic plaque which is irregular at the level of the distal left common iliac. No flow limiting stenosis or aneurysm. Veins: Unremarkable in the arterial phase Review of the MIP images confirms the above findings. NON-VASCULAR Hepatobiliary: No focal liver abnormality.No evidence of biliary obstruction or stone. Pancreas: Unremarkable. Spleen: Unremarkable. Adrenals/Urinary Tract: Negative adrenals. No hydronephrosis or stone. Patchy bilateral renal cortical scarring. Unremarkable bladder. Stomach/Bowel: No obstruction. No appendicitis. Left colonic diverticulosis. Lymphatic: No mass or adenopathy. Reproductive:Negative Other: No ascites or pneumoperitoneum. Musculoskeletal: No acute abnormalities. Review of the MIP images confirms the above findings. IMPRESSION: 1. Negative for aortic dissection or  other acute vascular finding. 2. Left posterior twelfth rib fracture with mild displacement. 3. Emphysema with 7 mm right lower lobe pulmonary nodule. Non-contrast chest CT at 6-12 months is recommended. If the nodule is stable at time of repeat CT, then future CT at 18-24 months (from today's scan) is considered optional for low-risk patients, but is recommended for high-risk patients. This recommendation follows the consensus statement: Guidelines for Management of Incidental Pulmonary Nodules Detected on CT Images: From the Fleischner Society 2017; Radiology 2017; 284:228-243. 4. Atherosclerosis with notable irregular plaque at the left common iliac artery. Electronically Signed   By: Monte Fantasia M.D.   On: 06/07/2018 14:46    Wt Readings from Last 3 Encounters:  06/08/18 62.3 kg    EKG: Normal sinus rhythm with no ischemic changes.  Physical Exam: African-American male in no acute distress Blood pressure (!) 159/103, pulse 70, temperature 98 F (36.7 C), temperature source Oral, resp. rate 19, height 5\' 8"  (1.727 m), weight 62.3 kg, SpO2 100 %. Body mass index is 20.89 kg/m. General: Well developed, well nourished, in no acute distress. Head: Normocephalic, atraumatic, sclera non-icteric, no xanthomas, nares are without discharge.  Neck: Negative for carotid bruits. JVD not elevated. Lungs: Clear bilaterally to auscultation without wheezes, rales, or rhonchi. Breathing is unlabored. Heart: RRR with S1 S2. No murmurs, rubs, or gallops appreciated. Abdomen: Soft, non-tender, non-distended with normoactive bowel sounds. No hepatomegaly. No rebound/guarding. No obvious abdominal masses. Msk:  Strength and tone appear normal for age. Extremities: No clubbing or cyanosis. No edema.  Distal pedal pulses are 2+ and equal bilaterally. Neuro: Alert and oriented X 3. No facial asymmetry. No focal deficit. Moves all extremities spontaneously. Psych:  Responds to questions appropriately with a  normal affect.     Assessment and Plan  57 year old male with history of reported hypertension and ethanol abuse as well as tobacco abuse admitted after presenting to the emergency room complaining of right-sided chest pain radiating down the right arm with some numbness in his right arm.  This occurred without exertion.  He has not had this before.  He denied any trauma to this area.  Chest CT was unremarkable.  Electrocardiogram was normal.  Chest x-ray showed no acute cardiopulmonary disease.  He was significantly hypertensive on presentation.  He has a history of hypertension has not seen a doctor in some time.  He does a fairly physical job with no chest pain with exertion.  Initial troponin was normal subsequent values  were 0.8 and 0.8.  He has no active chest pain.  Etiology of pain does not appear ischemic.  Is very atypical for ischemic chest pain as it is right-sided.  Elevated troponin likely secondary to increased demand due to accelerated hypertension which is improved with therapy.  Will discontinue heparin and continue current antihypertensive sit to try to improve his systolic blood pressure.  Proceed with functional study in the a.m. to further evaluate ischemic risk.  Further recommendations after this is completed.  Signed, Teodoro Spray MD 06/08/2018, 12:51 PM Pager: [phone]

## 2018-06-08 NOTE — Progress Notes (Signed)
[facility] at [place] NAME: Darrell [name]    MR#:  [id number]  DATE OF BIRTH:  December 19, 1960  SUBJECTIVE:   Patient presented to the hospital due to chest pain and accelerated hypertension.  Chest pain has now resolved.  Blood pressures are somewhat labile but improved.  Troponins are mildly elevated.  Plan for nuclear medicine stress test tomorrow.  REVIEW OF SYSTEMS:    Review of Systems  Constitutional: Negative for chills and fever.  HENT: Negative for congestion and tinnitus.   Eyes: Negative for blurred vision and double vision.  Respiratory: Negative for cough, shortness of breath and wheezing.   Cardiovascular: Negative for chest pain, orthopnea and PND.  Gastrointestinal: Negative for abdominal pain, diarrhea, nausea and vomiting.  Genitourinary: Negative for dysuria and hematuria.  Neurological: Negative for dizziness, sensory change and focal weakness.  All other systems reviewed and are negative.   Nutrition: Heart Healthy Tolerating Diet: Yes Tolerating PT: Ambulatory   DRUG ALLERGIES:   Allergies  Allergen Reactions   Penicillins Hives    Did it involve swelling of the face/tongue/throat, SOB, or low BP? No Did it involve sudden or severe rash/hives, skin peeling, or any reaction on the inside of your mouth or nose? No Did you need to seek medical attention at a hospital or doctor's office? No When did it last happen? If all above answers are NO, may proceed with cephalosporin use.     VITALS:  Blood pressure (!) 159/103, pulse 70, temperature 98 F (36.7 C), temperature source Oral, resp. rate 19, height 5\' 8"  (1.727 m), weight 62.3 kg, SpO2 100 %.  PHYSICAL EXAMINATION:   Physical Exam  GENERAL:  58 y.o.-year-old patient lying in bed in no acute distress.  EYES: Pupils equal, round, reactive to light and accommodation. No scleral icterus. Extraocular muscles intact.  HEENT: Head atraumatic,  normocephalic. Oropharynx and nasopharynx clear.  NECK:  Supple, no jugular venous distention. No thyroid enlargement, no tenderness.  LUNGS: Normal breath sounds bilaterally, no wheezing, rales, rhonchi. No use of accessory muscles of respiration.  CARDIOVASCULAR: S1, S2 normal. No murmurs, rubs, or gallops.  ABDOMEN: Soft, nontender, nondistended. Bowel sounds present. No organomegaly or mass.  EXTREMITIES: No cyanosis, clubbing or edema b/l.    NEUROLOGIC: Cranial nerves II through XII are intact. No focal Motor or sensory deficits b/l.   PSYCHIATRIC: The patient is alert and oriented x 3.  SKIN: No obvious rash, lesion, or ulcer.    LABORATORY PANEL:   CBC Recent Labs  Lab 06/08/18 0200  WBC 8.5  HGB 13.7  HCT 39.4  PLT 284   ------------------------------------------------------------------------------------------------------------------  Chemistries  Recent Labs  Lab 06/07/18 1306 06/08/18 0200  NA 140 138  K 4.4 3.5  CL 107 105  CO2 23 23  GLUCOSE 93 98  BUN 21* 14  CREATININE 0.83 0.76  CALCIUM 8.8* 9.1  MG  --  2.0  AST 44*  --   ALT 44  --   ALKPHOS 89  --   BILITOT 0.7  --    ------------------------------------------------------------------------------------------------------------------  Cardiac Enzymes Recent Labs  Lab 06/07/18 1926  TROPONINI 0.82*   ------------------------------------------------------------------------------------------------------------------  RADIOLOGY:  Dg Chest Port 1 View  Result Date: 06/07/2018 CLINICAL DATA:  Pt presents today complaining of right-sided chest pain which radiated down his right arm, began shortly before arrival at rest. Pt has states prior history of cancer but not for the last several years. EXAM: PORTABLE CHEST  1 VIEW COMPARISON:  None. FINDINGS: There is mild bilateral interstitial thickening. There is no focal parenchymal opacity. There is no pleural effusion or pneumothorax. The heart and  mediastinal contours are unremarkable. The osseous structures are unremarkable. IMPRESSION: No acute cardiopulmonary disease. Electronically Signed   By: Kathreen Devoid   On: 06/07/2018 13:24   Ct Angio Chest/abd/pel For Dissection W And/or Wo Contrast  Result Date: 06/07/2018 CLINICAL DATA:  Right-sided chest pain extending down the right arm EXAM: CT ANGIOGRAPHY CHEST, ABDOMEN AND PELVIS TECHNIQUE: Multidetector CT imaging through the chest, abdomen and pelvis was performed using the standard protocol during bolus administration of intravenous contrast. Multiplanar reconstructed images and MIPs were obtained and reviewed to evaluate the vascular anatomy. CONTRAST:  126mL ISOVUE-370 IOPAMIDOL (ISOVUE-370) INJECTION 76% COMPARISON:  Chest x-ray from earlier today FINDINGS: CTA CHEST FINDINGS Cardiovascular: Normal heart size. No pericardial effusion. Noncontrast phase shows no intramural hematoma within the aorta. Postcontrast imaging shows no dissection. No aortic aneurysm or calcification. There is limited opacification of the pulmonary arteries with no central filling defect. Mediastinum/Nodes: Negative for adenopathy Lungs/Pleura: Emphysema. There is a smoothly contoured right middle lobe nodule measuring 7 mm average diameter. There is no edema, consolidation, effusion, or pneumothorax. Musculoskeletal: Degenerative endplate spurring. Displaced posterior left twelfth rib fracture that appears recent. Review of the MIP images confirms the above findings. CTA ABDOMEN AND PELVIS FINDINGS VASCULAR Aorta: Atherosclerotic plaque.  No dissection or aneurysm. Celiac: Truncated appearance of the celiac axis with right hepatic artery flow seemingly from pancreatic arcade collaterals and the left hepatic arising from the gastric. This has a chronic appearance. SMA: Widely patent. Renals: Single bilateral renal arteries.  No stenosis or occlusion. IMA: Diffusely patent. Inflow: Atherosclerotic plaque which is irregular at  the level of the distal left common iliac. No flow limiting stenosis or aneurysm. Veins: Unremarkable in the arterial phase Review of the MIP images confirms the above findings. NON-VASCULAR Hepatobiliary: No focal liver abnormality.No evidence of biliary obstruction or stone. Pancreas: Unremarkable. Spleen: Unremarkable. Adrenals/Urinary Tract: Negative adrenals. No hydronephrosis or stone. Patchy bilateral renal cortical scarring. Unremarkable bladder. Stomach/Bowel: No obstruction. No appendicitis. Left colonic diverticulosis. Lymphatic: No mass or adenopathy. Reproductive:Negative Other: No ascites or pneumoperitoneum. Musculoskeletal: No acute abnormalities. Review of the MIP images confirms the above findings. IMPRESSION: 1. Negative for aortic dissection or other acute vascular finding. 2. Left posterior twelfth rib fracture with mild displacement. 3. Emphysema with 7 mm right lower lobe pulmonary nodule. Non-contrast chest CT at 6-12 months is recommended. If the nodule is stable at time of repeat CT, then future CT at 18-24 months (from today's scan) is considered optional for low-risk patients, but is recommended for high-risk patients. This recommendation follows the consensus statement: Guidelines for Management of Incidental Pulmonary Nodules Detected on CT Images: From the Fleischner Society 2017; Radiology 2017; 284:228-243. 4. Atherosclerosis with notable irregular plaque at the left common iliac artery. Electronically Signed   By: Monte Fantasia M.D.   On: 06/07/2018 14:46     ASSESSMENT AND PLAN:   58 year old male with no significant past medical history although has not seen a physician in a while presented to the hospital with chest pain and also noted to have accelerated hypertension.  1.  Chest pain with elevated troponin-initially thought to have a non-ST elevation MI but troponins have remained flat and did not trend significantly upwards.  Patient is clinically asymptomatic. -Seen  by cardiology and heparin drip discontinued presently. - Plan for nuclear medicine stress test  tomorrow. -Continue aspirin, nitroglycerin as needed.  2.  Accelerated hypertension- patient has family history of hypertension and has not seen any a primary care physician.  Blood pressures improved since yesterday.  Continue Norvasc, hydrochlorothiazide.  Continue IV hydralazine as needed.  3.  Alcohol abuse- continue CIWA protocol. -No evidence of withdrawal presently. -Continue thiamine, folate.  Possible discharge tomorrow if stress test is negative.  All the records are reviewed and case discussed with Care Management/Social Worker. Management plans discussed with the patient, family and they are in agreement.  CODE STATUS: Full code  DVT Prophylaxis: Lovenox  TOTAL TIME TAKING CARE OF THIS PATIENT: 30 minutes.   POSSIBLE D/C IN 1-2 DAYS, DEPENDING ON CLINICAL CONDITION.   Henreitta Leber M.D on 06/08/2018 at 11:52 AM  Between 7am to 6pm - Pager - [phone]  After 6pm go to [email or web address] - Proofreader  [facility]  Office  [phone]  CC: Primary care physician; Patient, No Pcp Per

## 2018-06-08 NOTE — Consult Note (Signed)
[facility] for Heparin dosing  Indication: chest pain/ACS   Allergies  Allergen Reactions  . Penicillins Hives    Did it involve swelling of the face/tongue/throat, SOB, or low BP? No Did it involve sudden or severe rash/hives, skin peeling, or any reaction on the inside of your mouth or nose? No Did you need to seek medical attention at a hospital or doctor's office? No When did it last happen? If all above answers are "NO", may proceed with cephalosporin use.     Patient Measurements: Height: 5\' 8"  (172.7 cm) Weight: 136 lb 12.8 oz (62.1 kg) IBW/kg (Calculated) : 68.4 Heparin Dosing Weight: 62.1  Kg   Vital Signs: Temp: 98.5 F (36.9 C) (05/02 1939) Temp Source: Oral (05/02 1939) BP: 178/104 (05/02 1939) Pulse Rate: 83 (05/02 1939)  Labs: Recent Labs    06/07/18 1306 06/07/18 1816 06/07/18 1926 06/08/18 0200  HGB 14.2  --   --  13.7  HCT 41.3  --   --  39.4  PLT 288  --   --  284  APTT  --   --  29  --   LABPROT 13.2  --   --   --   INR 1.0  --   --   --   HEPARINUNFRC  --   --   --  0.17*  CREATININE 0.83  --   --  0.76  TROPONINI <0.03 0.83* 0.82*  --     Estimated Creatinine Clearance: 88.4 mL/min (by C-G formula based on SCr of 0.76 mg/dL).   Medications:  Med reconciliation completed and no meds recorded. Enoxaparin was ordered, but was not been given.   Assessment:  Patient presented with CP and noted to have NSTEMI. He was given aspirin in the ED.   Troponin <0.03 >> 0.83  Heparin infusion started @ 700 units/hr on 5/2   Goal of Therapy:  Heparin level 0.3-0.7 units/ml Monitor platelets by anticoagulation protocol: Yes   Plan:  5/3 0200 HL: 0.17. Level is subtherapeutic. Will order 1800 unit bolus and increase infusion to 900 units/hr.  Recheck anti-Xa level in 6 hours and daily while on heparin, per protocol Continue to monitor H&H and platelets  Pernell Dupre, PharmD, BCPS Clinical  Pharmacist 06/08/2018 3:49 AM

## 2018-06-09 ENCOUNTER — Observation Stay: Payer: PRIVATE HEALTH INSURANCE

## 2018-06-09 LAB — CBC
HCT: 41.8 % (ref 39.0–52.0)
Hemoglobin: 14.4 g/dL (ref 13.0–17.0)
MCH: 31.9 pg (ref 26.0–34.0)
MCHC: 34.4 g/dL (ref 30.0–36.0)
MCV: 92.7 fL (ref 80.0–100.0)
Platelets: 310 10*3/uL (ref 150–400)
RBC: 4.51 MIL/uL (ref 4.22–5.81)
RDW: 14.6 % (ref 11.5–15.5)
WBC: 7.4 10*3/uL (ref 4.0–10.5)
nRBC: 0 % (ref 0.0–0.2)

## 2018-06-09 LAB — NM MYOCAR MULTI W/SPECT W/WALL MOTION / EF
Estimated workload: 11.7 METS
Exercise duration (min): 10 min
Exercise duration (sec): 0 s
LV dias vol: 85 mL (ref 62–150)
LV sys vol: 46 mL
MPHR: 162 {beats}/min
Peak HR: 148 {beats}/min
Percent HR: 91 %
Rest HR: 65 {beats}/min
SDS: 0
SRS: 0
SSS: 0
TID: 0.87

## 2018-06-09 LAB — HIV ANTIBODY (ROUTINE TESTING W REFLEX): HIV Screen 4th Generation wRfx: NONREACTIVE

## 2018-06-09 MED ORDER — AMLODIPINE BESYLATE 5 MG PO TABS: 5.0000 mg | ORAL_TABLET | Freq: Every day | ORAL | 1 refills | Status: DC

## 2018-06-09 MED ORDER — LOSARTAN POTASSIUM-HCTZ 50-12.5 MG PO TABS: 1.0000 | ORAL_TABLET | Freq: Every day | ORAL | 1 refills | Status: DC

## 2018-06-09 MED ORDER — TECHNETIUM TC 99M TETROFOSMIN IV KIT
10.0000 | PACK | Freq: Once | INTRAVENOUS | Status: AC | PRN
  Administered 2018-06-09: 11.06 via INTRAVENOUS

## 2018-06-09 MED ORDER — LOSARTAN POTASSIUM 50 MG PO TABS
50.0000 mg | ORAL_TABLET | Freq: Every day | ORAL | Status: DC
  Administered 2018-06-09: 50 mg via ORAL
  Filled 2018-06-09: qty 1

## 2018-06-09 MED ORDER — TECHNETIUM TO 99M ALBUMIN AGGREGATED
30.0000 | Freq: Once | INTRAVENOUS | Status: AC | PRN
  Administered 2018-06-09: 31.02 via INTRAVENOUS

## 2018-06-09 NOTE — TOC Transition Note (Signed)
Transition of Care [facility]) - CM/SW Discharge Note   Patient Details  Name: Darrell [name] MRN: [id number] Date of Birth: [birth year]  Transition of Care [facility]) CM/SW Contact:  Elza Rafter, RN Phone Number: 06/09/2018, 2:31 PM   Clinical Narrative:   Patient is discharging today.  Was admitted with chest pain.  Has insurance.  No PCP-list of local PCP's provided.  He states he will call around and make an appointment with a PCP that will take his insurance.  No transportation or medication needs.      Final next level of care: Home/Self Care Barriers to Discharge: No Barriers Identified   Patient Goals and CMS Choice        Discharge Placement                       Discharge Plan and Services In-house Referral: PCP / Health Connect Discharge Planning Services: CM Consult                                 Social Determinants of Health (SDOH) Interventions     Readmission Risk Interventions No flowsheet data found.

## 2018-06-09 NOTE — Discharge Summary (Signed)
[place] [facility] [place] NAME: Darrell [name]    MR#:  [id number]  DATE OF BIRTH:  06-15-1960  DATE OF ADMISSION:  06/07/2018 ADMITTING PHYSICIAN: Otila Back, MD  DATE OF DISCHARGE: 06/09/2018  PRIMARY CARE PHYSICIAN: Patient, No Pcp Per    ADMISSION DIAGNOSIS:  Chest pain [R07.9] Chest pain, unspecified type [R07.9] Hypertension, unspecified type [I10]  DISCHARGE DIAGNOSIS:  Active Problems:   Chest pain   SECONDARY DIAGNOSIS:   Past Medical History:  Diagnosis Date  . Cancer ([place])   . Hypertension     HOSPITAL COURSE:   58 year old male with no significant past medical history although has not seen a physician in a while presented to the hospital with chest pain and also noted to have accelerated hypertension.  1.  Chest pain with elevated troponin-initially thought to have a non-ST elevation MI but troponins  remained flat and NSTEMI ruled out.  -Initially patient was on a heparin drip but that was discontinued.  Patient was seen by cardiology and underwent a nuclear medicine stress test which showed no evidence of acute ischemia.  Patient's chest pain has now resolved.  Troponins were likely elevated due to accelerated hypertension and demand ischemia. -Patient is clinically stable and therefore being discharged home.  2.  Accelerated hypertension- patient has family history of hypertension and has not seen any a primary care physician.   While in the hospital patient was started on Norvasc, hydrochlorothiazide and also given some IV hydralazine as needed.  Patient's blood pressures have improved.  Presently patient is being discharged on losartan/HCTZ along with low-dose Norvasc -Further changes can be made as an outpatient.  3.  Alcohol abuse- while in the hospital patient was on CIWA protocol.  He had no evidence of alcohol withdrawal. -He was advised to strongly abstain from alcohol.  DISCHARGE CONDITIONS:   Stable.    CONSULTS OBTAINED:  Treatment Team:  Teodoro Spray, MD  DRUG ALLERGIES:   Allergies  Allergen Reactions  . Penicillins Hives    Did it involve swelling of the face/tongue/throat, SOB, or low BP? No Did it involve sudden or severe rash/hives, skin peeling, or any reaction on the inside of your mouth or nose? No Did you need to seek medical attention at a hospital or doctor's office? No When did it last happen? If all above answers are "NO", may proceed with cephalosporin use.     DISCHARGE MEDICATIONS:   Allergies as of 06/09/2018      Reactions   Penicillins Hives   Did it involve swelling of the face/tongue/throat, SOB, or low BP? No Did it involve sudden or severe rash/hives, skin peeling, or any reaction on the inside of your mouth or nose? No Did you need to seek medical attention at a hospital or doctor's office? No When did it last happen? If all above answers are "NO", may proceed with cephalosporin use.      Medication List    TAKE these medications   amLODipine 5 MG tablet Commonly known as:  NORVASC Take 1 tablet (5 mg total) by mouth daily. Start taking on:  Jun 10, 2018   losartan-hydrochlorothiazide 50-12.5 MG tablet Commonly known as:  HYZAAR Take 1 tablet by mouth daily.         DISCHARGE INSTRUCTIONS:   DIET:  Cardiac diet  DISCHARGE CONDITION:  Stable  ACTIVITY:  Activity as tolerated  OXYGEN:  Home Oxygen: No.   Oxygen Delivery: room air  DISCHARGE  LOCATION:  home   If you experience worsening of your admission symptoms, develop shortness of breath, life threatening emergency, suicidal or homicidal thoughts you must seek medical attention immediately by calling 911 or calling your MD immediately  if symptoms less severe.  You Must read complete instructions/literature along with all the possible adverse reactions/side effects for all the Medicines you take and that have been prescribed to you. Take any new Medicines  after you have completely understood and accpet all the possible adverse reactions/side effects.   Please note  You were cared for by a hospitalist during your hospital stay. If you have any questions about your discharge medications or the care you received while you were in the hospital after you are discharged, you can call the unit and asked to speak with the hospitalist on call if the hospitalist that took care of you is not available. Once you are discharged, your primary care physician will handle any further medical issues. Please note that NO REFILLS for any discharge medications will be authorized once you are discharged, as it is imperative that you return to your primary care physician (or establish a relationship with a primary care physician if you do not have one) for your aftercare needs so that they can reassess your need for medications and monitor your lab values.     Today   No acute events overnight.  Chest pain has resolved.  Had stress test this morning.  No other acute complaints and will discharge home today.  VITAL SIGNS:  Blood pressure (!) 157/102, pulse 67, temperature 98.4 F (36.9 C), temperature source Oral, resp. rate 20, height 5\' 8"  (1.727 m), weight 61.5 kg, SpO2 100 %.  I/O:    Intake/Output Summary (Last 24 hours) at 06/09/2018 1310 Last data filed at 06/09/2018 0300 Gross per 24 hour  Intake -  Output 300 ml  Net -300 ml    PHYSICAL EXAMINATION:  GENERAL:  58 y.o.-year-old patient lying in the bed with no acute distress.  EYES: Pupils equal, round, reactive to light and accommodation. No scleral icterus. Extraocular muscles intact.  HEENT: Head atraumatic, normocephalic. Oropharynx and nasopharynx clear.  NECK:  Supple, no jugular venous distention. No thyroid enlargement, no tenderness.  LUNGS: Normal breath sounds bilaterally, no wheezing, rales,rhonchi. No use of accessory muscles of respiration.  CARDIOVASCULAR: S1, S2 normal. No murmurs, rubs,  or gallops.  ABDOMEN: Soft, non-tender, non-distended. Bowel sounds present. No organomegaly or mass.  EXTREMITIES: No pedal edema, cyanosis, or clubbing.  NEUROLOGIC: Cranial nerves II through XII are intact. No focal motor or sensory defecits b/l.  PSYCHIATRIC: The patient is alert and oriented x 3.  SKIN: No obvious rash, lesion, or ulcer.   DATA REVIEW:   CBC Recent Labs  Lab 06/09/18 0331  WBC 7.4  HGB 14.4  HCT 41.8  PLT 310    Chemistries  Recent Labs  Lab 06/07/18 1306 06/08/18 0200  NA 140 138  K 4.4 3.5  CL 107 105  CO2 23 23  GLUCOSE 93 98  BUN 21* 14  CREATININE 0.83 0.76  CALCIUM 8.8* 9.1  MG  --  2.0  AST 44*  --   ALT 44  --   ALKPHOS 89  --   BILITOT 0.7  --     Cardiac Enzymes Recent Labs  Lab 06/07/18 1926  TROPONINI 0.82*    Microbiology Results  No results found for this or any previous visit.  RADIOLOGY:  Dg Chest Port 1  View  Result Date: 06/07/2018 CLINICAL DATA:  Pt presents today complaining of right-sided chest pain which radiated down his right arm, began shortly before arrival at rest. Pt has states prior history of cancer but not for the last several years. EXAM: PORTABLE CHEST 1 VIEW COMPARISON:  None. FINDINGS: There is mild bilateral interstitial thickening. There is no focal parenchymal opacity. There is no pleural effusion or pneumothorax. The heart and mediastinal contours are unremarkable. The osseous structures are unremarkable. IMPRESSION: No acute cardiopulmonary disease. Electronically Signed   By: Kathreen Devoid   On: 06/07/2018 13:24   Ct Angio Chest/abd/pel For Dissection W And/or Wo Contrast  Result Date: 06/07/2018 CLINICAL DATA:  Right-sided chest pain extending down the right arm EXAM: CT ANGIOGRAPHY CHEST, ABDOMEN AND PELVIS TECHNIQUE: Multidetector CT imaging through the chest, abdomen and pelvis was performed using the standard protocol during bolus administration of intravenous contrast. Multiplanar reconstructed  images and MIPs were obtained and reviewed to evaluate the vascular anatomy. CONTRAST:  151mL ISOVUE-370 IOPAMIDOL (ISOVUE-370) INJECTION 76% COMPARISON:  Chest x-ray from earlier today FINDINGS: CTA CHEST FINDINGS Cardiovascular: Normal heart size. No pericardial effusion. Noncontrast phase shows no intramural hematoma within the aorta. Postcontrast imaging shows no dissection. No aortic aneurysm or calcification. There is limited opacification of the pulmonary arteries with no central filling defect. Mediastinum/Nodes: Negative for adenopathy Lungs/Pleura: Emphysema. There is a smoothly contoured right middle lobe nodule measuring 7 mm average diameter. There is no edema, consolidation, effusion, or pneumothorax. Musculoskeletal: Degenerative endplate spurring. Displaced posterior left twelfth rib fracture that appears recent. Review of the MIP images confirms the above findings. CTA ABDOMEN AND PELVIS FINDINGS VASCULAR Aorta: Atherosclerotic plaque.  No dissection or aneurysm. Celiac: Truncated appearance of the celiac axis with right hepatic artery flow seemingly from pancreatic arcade collaterals and the left hepatic arising from the gastric. This has a chronic appearance. SMA: Widely patent. Renals: Single bilateral renal arteries.  No stenosis or occlusion. IMA: Diffusely patent. Inflow: Atherosclerotic plaque which is irregular at the level of the distal left common iliac. No flow limiting stenosis or aneurysm. Veins: Unremarkable in the arterial phase Review of the MIP images confirms the above findings. NON-VASCULAR Hepatobiliary: No focal liver abnormality.No evidence of biliary obstruction or stone. Pancreas: Unremarkable. Spleen: Unremarkable. Adrenals/Urinary Tract: Negative adrenals. No hydronephrosis or stone. Patchy bilateral renal cortical scarring. Unremarkable bladder. Stomach/Bowel: No obstruction. No appendicitis. Left colonic diverticulosis. Lymphatic: No mass or adenopathy.  Reproductive:Negative Other: No ascites or pneumoperitoneum. Musculoskeletal: No acute abnormalities. Review of the MIP images confirms the above findings. IMPRESSION: 1. Negative for aortic dissection or other acute vascular finding. 2. Left posterior twelfth rib fracture with mild displacement. 3. Emphysema with 7 mm right lower lobe pulmonary nodule. Non-contrast chest CT at 6-12 months is recommended. If the nodule is stable at time of repeat CT, then future CT at 18-24 months (from today's scan) is considered optional for low-risk patients, but is recommended for high-risk patients. This recommendation follows the consensus statement: Guidelines for Management of Incidental Pulmonary Nodules Detected on CT Images: From the Fleischner Society 2017; Radiology 2017; 284:228-243. 4. Atherosclerosis with notable irregular plaque at the left common iliac artery. Electronically Signed   By: Monte Fantasia M.D.   On: 06/07/2018 14:46      Management plans discussed with the patient, family and they are in agreement.  CODE STATUS:     Code Status Orders  (From admission, onward)         Start  Ordered   06/07/18 1644  Full code  Continuous     06/07/18 1644        Code Status History    This patient has a current code status but no historical code status.      TOTAL TIME TAKING CARE OF THIS PATIENT: 40 minutes.    Henreitta Leber M.D on 06/09/2018 at 1:10 PM  Between 7am to 6pm - Pager - [phone]  After 6pm go to [email or web address] - Proofreader  [facility] [place] [facility]  Office  [phone]  CC: Primary care physician; Patient, No Pcp Per

## 2018-09-07 ENCOUNTER — Emergency Department: Payer: PRIVATE HEALTH INSURANCE

## 2018-09-07 ENCOUNTER — Observation Stay
Admission: EM | Admit: 2018-09-07 | Discharge: 2018-09-08 | Disposition: A | Discharge status: Home / Self Care | Payer: PRIVATE HEALTH INSURANCE | Attending: Internal Medicine | Admitting: Internal Medicine

## 2018-09-07 ENCOUNTER — Encounter: Payer: Self-pay | Admitting: Emergency Medicine

## 2018-09-07 ENCOUNTER — Other Ambulatory Visit: Payer: Self-pay

## 2018-09-07 DIAGNOSIS — I252 Old myocardial infarction: Secondary | ICD-10-CM | POA: Insufficient documentation

## 2018-09-07 DIAGNOSIS — F17290 Nicotine dependence, other tobacco product, uncomplicated: Secondary | ICD-10-CM | POA: Insufficient documentation

## 2018-09-07 DIAGNOSIS — Z880 Allergy status to penicillin: Secondary | ICD-10-CM | POA: Insufficient documentation

## 2018-09-07 DIAGNOSIS — E785 Hyperlipidemia, unspecified: Secondary | ICD-10-CM | POA: Insufficient documentation

## 2018-09-07 DIAGNOSIS — I2510 Atherosclerotic heart disease of native coronary artery without angina pectoris: Secondary | ICD-10-CM | POA: Diagnosis not present

## 2018-09-07 DIAGNOSIS — R079 Chest pain, unspecified: Secondary | ICD-10-CM

## 2018-09-07 DIAGNOSIS — R55 Syncope and collapse: Secondary | ICD-10-CM

## 2018-09-07 DIAGNOSIS — I10 Essential (primary) hypertension: Secondary | ICD-10-CM | POA: Insufficient documentation

## 2018-09-07 DIAGNOSIS — Z79899 Other long term (current) drug therapy: Secondary | ICD-10-CM | POA: Insufficient documentation

## 2018-09-07 LAB — ETHANOL: Alcohol, Ethyl (B): <10 mg/dL (ref <10)

## 2018-09-07 LAB — CBC
HCT: 32.7 % — ABNORMAL LOW (ref 39.0–52.0)
Hemoglobin: 11.4 g/dL — ABNORMAL LOW (ref 13.0–17.0)
MCH: 32.6 pg (ref 26.0–34.0)
MCHC: 34.9 g/dL (ref 30.0–36.0)
MCV: 93.4 fL (ref 80.0–100.0)
Platelets: 280 10*3/uL (ref 150–400)
RBC: 3.5 MIL/uL — ABNORMAL LOW (ref 4.22–5.81)
RDW: 14.1 % (ref 11.5–15.5)
WBC: 6.1 10*3/uL (ref 4.0–10.5)
nRBC: 0 % (ref 0.0–0.2)

## 2018-09-07 LAB — BASIC METABOLIC PANEL
Anion gap: 9 (ref 5–15)
BUN: 26 mg/dL — ABNORMAL HIGH (ref 6–20)
CO2: 22 mmol/L (ref 22–32)
Calcium: 8.6 mg/dL — ABNORMAL LOW (ref 8.9–10.3)
Chloride: 109 mmol/L (ref 98–111)
Creatinine, Ser: 1.11 mg/dL (ref 0.61–1.24)
GFR calc Af Amer: >60 mL/min (ref >60)
GFR calc non Af Amer: >60 mL/min (ref >60)
Glucose, Bld: 104 mg/dL — ABNORMAL HIGH (ref 70–99)
Potassium: 3.5 mmol/L (ref 3.5–5.1)
Sodium: 140 mmol/L (ref 135–145)

## 2018-09-07 LAB — APTT: aPTT: 29 seconds (ref 24–36)

## 2018-09-07 LAB — PROTIME-INR
INR: 1 (ref 0.8–1.2)
Prothrombin Time: 12.7 seconds (ref 11.4–15.2)

## 2018-09-07 LAB — TROPONIN I (HIGH SENSITIVITY)
Troponin I (High Sensitivity): 23 ng/L — ABNORMAL HIGH (ref <18)
Troponin I (High Sensitivity): 92 ng/L — ABNORMAL HIGH (ref <18)

## 2018-09-07 MED ORDER — HYDROCHLOROTHIAZIDE 12.5 MG PO CAPS
12.5000 mg | ORAL_CAPSULE | Freq: Every day | ORAL | Status: DC
  Administered 2018-09-08: 12.5 mg via ORAL
  Filled 2018-09-07: qty 1

## 2018-09-07 MED ORDER — HEPARIN (PORCINE) 25000 UT/250ML-% IV SOLN
800.0000 [IU]/h | INTRAVENOUS | Status: DC
  Administered 2018-09-07: 800 [IU]/h via INTRAVENOUS
  Filled 2018-09-07: qty 250

## 2018-09-07 MED ORDER — ASPIRIN 81 MG PO CHEW
81.0000 mg | CHEWABLE_TABLET | ORAL | Status: AC
  Administered 2018-09-08: 81 mg via ORAL
  Filled 2018-09-07: qty 1

## 2018-09-07 MED ORDER — SODIUM CHLORIDE 0.9% FLUSH: 3.0000 mL | INTRAVENOUS | Status: DC | PRN

## 2018-09-07 MED ORDER — SODIUM CHLORIDE 0.9% FLUSH
3.0000 mL | Freq: Two times a day (BID) | INTRAVENOUS | Status: DC
  Administered 2018-09-07: 3 mL via INTRAVENOUS

## 2018-09-07 MED ORDER — ONDANSETRON HCL 4 MG/2ML IJ SOLN: 4.0000 mg | Freq: Four times a day (QID) | INTRAMUSCULAR | Status: DC | PRN

## 2018-09-07 MED ORDER — NITROGLYCERIN 0.4 MG SL SUBL: 0.4000 mg | SUBLINGUAL_TABLET | SUBLINGUAL | Status: DC | PRN

## 2018-09-07 MED ORDER — ASPIRIN EC 81 MG PO TBEC: 81.0000 mg | DELAYED_RELEASE_TABLET | Freq: Every day | ORAL | Status: DC

## 2018-09-07 MED ORDER — SODIUM CHLORIDE 0.9 % IV SOLN: 250.0000 mL | INTRAVENOUS | Status: DC | PRN

## 2018-09-07 MED ORDER — METOPROLOL TARTRATE 25 MG PO TABS
25.0000 mg | ORAL_TABLET | Freq: Two times a day (BID) | ORAL | Status: DC
  Administered 2018-09-07 – 2018-09-08 (×2): 25 mg via ORAL
  Filled 2018-09-07 (×2): qty 1

## 2018-09-07 MED ORDER — LOSARTAN POTASSIUM 50 MG PO TABS
50.0000 mg | ORAL_TABLET | Freq: Every day | ORAL | Status: DC
  Administered 2018-09-08: 50 mg via ORAL
  Filled 2018-09-07: qty 1

## 2018-09-07 MED ORDER — LOSARTAN POTASSIUM-HCTZ 50-12.5 MG PO TABS: 1.0000 | ORAL_TABLET | Freq: Every day | ORAL | Status: DC

## 2018-09-07 MED ORDER — ATORVASTATIN CALCIUM 20 MG PO TABS: 40.0000 mg | ORAL_TABLET | Freq: Every day | ORAL | Status: DC

## 2018-09-07 MED ORDER — ACETAMINOPHEN 325 MG PO TABS: 650.0000 mg | ORAL_TABLET | ORAL | Status: DC | PRN

## 2018-09-07 MED ORDER — SODIUM CHLORIDE 0.9 % WEIGHT BASED INFUSION: 1.0000 mL/kg/h | INTRAVENOUS | Status: DC

## 2018-09-07 MED ORDER — SODIUM CHLORIDE 0.9 % WEIGHT BASED INFUSION
3.0000 mL/kg/h | INTRAVENOUS | Status: AC
  Administered 2018-09-08: 3 mL/kg/h via INTRAVENOUS

## 2018-09-07 MED ORDER — HEPARIN BOLUS VIA INFUSION
4000.0000 [IU] | Freq: Once | INTRAVENOUS | Status: AC
  Administered 2018-09-07: 4000 [IU] via INTRAVENOUS
  Filled 2018-09-07: qty 4000

## 2018-09-07 NOTE — ED Triage Notes (Signed)
Per ems pt was at work and had a syncopal episode - sat in front of the fan to feel better. vss

## 2018-09-07 NOTE — Consult Note (Signed)
[facility] Consultation Note  Patient ID: Darrell [name], MRN: [id number], DOB/AGE: 09-01-1960 58 y.o. Admit date: 09/07/2018   Date of Consult: 09/07/2018 Primary Physician: Patient, No Pcp Per Primary Cardiologist: None  Chief Complaint:  Chief Complaint  Patient presents with  . Loss of Consciousness   Reason for Consult: Chest pain  HPI: 58 y.o. male with known hypertension hyperlipidemia and tobacco abuse for which the patient was seen last month in the emergency room with chest discomfort.  He had a stress test showing no evidence of myocardial ischemia but continued to have progression of chest discomfort.  At work today he had severe substernal chest discomfort radiating into his back and back tingling with dizziness weakness fatigue.  He also had shortness of breath and presyncope.  After lying on his back he did appear to have had some pass out spell.  He has continued chest pain until was seen in the emergency room with spontaneous relief.  Early troponin did show elevation at 36 and now up to 94 concerning for a 50% increase in troponin change with chest pain and unstable angina with significant sick risk factors consistent with a cardiac origin and possible acute coronary syndrome.  Patient currently is on appropriate medication management and now stable with no further chest discomfort  Past Medical History:  Diagnosis Date  . Cancer ([place])   . Hypertension       Surgical History:  Past Surgical History:  Procedure Laterality Date  . ROTATOR CUFF REPAIR       Home Meds: Prior to Admission medications   Medication Sig Start Date End Date Taking? Authorizing Provider  amLODipine (NORVASC) 5 MG tablet Take 1 tablet (5 mg total) by mouth daily. 06/10/18 09/07/18 Yes Sainani, Belia Heman, MD  losartan-hydrochlorothiazide (HYZAAR) 50-12.5 MG tablet Take 1 tablet by mouth daily. 06/09/18 09/07/18 Yes Henreitta Leber, MD    Inpatient Medications:  . heparin  4,000 Units  Intravenous Once  . sodium chloride flush  3 mL Intravenous Q12H   . heparin      Allergies:  Allergies  Allergen Reactions  . Penicillins Hives    Did it involve swelling of the face/tongue/throat, SOB, or low BP? No Did it involve sudden or severe rash/hives, skin peeling, or any reaction on the inside of your mouth or nose? No Did you need to seek medical attention at a hospital or doctor's office? No When did it last happen? If all above answers are "NO", may proceed with cephalosporin use.     Social History   Socioeconomic History  . Marital status: Legally Separated    Spouse name: Not on file  . Number of children: Not on file  . Years of education: Not on file  . Highest education level: Not on file  Occupational History  . Not on file  Social Needs  . Financial resource strain: Not on file  . Food insecurity    Worry: Not on file    Inability: Not on file  . Transportation needs    Medical: Not on file    Non-medical: Not on file  Tobacco Use  . Smoking status: Current Every Day Smoker    Types: Cigars  . Smokeless tobacco: Never Used  . Tobacco comment: 2 per day  Substance and Sexual Activity  . Alcohol use: Yes    Alcohol/week: 3.0 standard drinks    Types: 3 Cans of beer per week    Comment: daily  . Drug  use: Not on file  . Sexual activity: Not on file  Lifestyle  . Physical activity    Days per week: Not on file    Minutes per session: Not on file  . Stress: Not on file  Relationships  . Social Herbalist on phone: Not on file    Gets together: Not on file    Attends religious service: Not on file    Active member of club or organization: Not on file    Attends meetings of clubs or organizations: Not on file    Relationship status: Not on file  . Intimate partner violence    Fear of current or ex partner: Not on file    Emotionally abused: Not on file    Physically abused: Not on file    Forced sexual activity: Not on  file  Other Topics Concern  . Not on file  Social History Narrative  . Not on file     History reviewed. No pertinent family history.   Review of Systems Positive for pain Negative for: General:  chills, fever, night sweats or weight changes.  Cardiovascular: PND orthopnea syncope dizziness  Dermatological skin lesions rashes Respiratory: Cough congestion Urologic: Frequent urination urination at night and hematuria Abdominal: negative for nausea, vomiting, diarrhea, bright red blood per rectum, melena, or hematemesis Neurologic: negative for visual changes, and/or hearing changes  All other systems reviewed and are otherwise negative except as noted above.  Labs: No results for input(s): CKTOTAL, CKMB, TROPONINI in the last 72 hours. Lab Results  Component Value Date   WBC 6.1 09/07/2018   HGB 11.4 (L) 09/07/2018   HCT 32.7 (L) 09/07/2018   MCV 93.4 09/07/2018   PLT 280 09/07/2018    Recent Labs  Lab 09/07/18 1605  NA 140  K 3.5  CL 109  CO2 22  BUN 26*  CREATININE 1.11  CALCIUM 8.6*  GLUCOSE 104*   No results found for: CHOL, HDL, LDLCALC, TRIG No results found for: DDIMER  Radiology/Studies:  Dg Chest Port 1 View  Result Date: 09/07/2018 CLINICAL DATA:  Syncopal episode today.  Chest pain. EXAM: PORTABLE CHEST 1 VIEW COMPARISON:  Jun 07, 2018 FINDINGS: The heart size and mediastinal contours are within normal limits. Mild linear atelectasis of left lung base is noted. There is no focal pneumonia or pleural effusion. The visualized skeletal structures are stable. IMPRESSION: Mild atelectasis of left lung base.  No focal pneumonia. Electronically Signed   By: Abelardo Diesel M.D.   On: 09/07/2018 15:17    EKG: Normal sinus rhythm.  Normal EKG  Weights: Filed Weights   09/07/18 1500  Weight: 65.8 kg     Physical Exam: Blood pressure (!) 149/101, pulse 81, temperature 98.3 F (36.8 C), temperature source Oral, resp. rate (!) 21, height 5\' 8"  (1.727 m), weight  65.8 kg, SpO2 100 %. Body mass index is 22.05 kg/m. General: Well developed, well nourished, in no acute distress. Head eyes ears nose throat: Normocephalic, atraumatic, sclera non-icteric, no xanthomas, nares are without discharge. No apparent thyromegaly and/or mass  Lungs: Normal respiratory effort.  no wheezes, no rales, no rhonchi.  Heart: RRR with normal S1 S2. no murmur gallop, no rub, PMI is normal size and placement, carotid upstroke normal without bruit, jugular venous pressure is normal Abdomen: Soft, non-tender, non-distended with normoactive bowel sounds. No hepatomegaly. No rebound/guarding. No obvious abdominal masses. Abdominal aorta is normal size without bruit Extremities: No edema. no cyanosis, no clubbing,  no ulcers  Peripheral : 2+ bilateral upper extremity pulses, 2+ bilateral femoral pulses, 2+ bilateral dorsal pedal pulse Neuro: Alert and oriented. No facial asymmetry. No focal deficit. Moves all extremities spontaneously. Musculoskeletal: Normal muscle tone without kyphosis Psych:  Responds to questions appropriately with a normal affect.    Assessment: 58 year old male with hypertension hyperlipidemia severe tobacco abuse with unstable angina needing further treatment options.  Plan: 1.  Continue medication management for hypertension control including amlodipine and beta-blocker 2.  Aspirin and heparin for further risk reduction of possible myocardial infarction 3.  Serial ECG and enzymes to assess for possible myocardial infarction 4.  High intensity cholesterol therapy 5.  Proceed to cardiac catheterization to assess coronary anatomy and further treatment thereof as necessary.  Patient understands the risks and benefits of cardiac catheterization.  This includes the possibility of death stroke heart attack infection bleeding or blood clot.  He is at low risk for conscious sedation  Signed, Corey Skains M.D. [facility] 09/07/2018, 7:48  PM

## 2018-09-07 NOTE — Consult Note (Addendum)
ANTICOAGULATION CONSULT NOTE - Initial Consult  Pharmacy Consult for Heparin Dosing Indication: chest pain/ACS  Allergies  Allergen Reactions  . Penicillins Hives    Did it involve swelling of the face/tongue/throat, SOB, or low BP? No Did it involve sudden or severe rash/hives, skin peeling, or any reaction on the inside of your mouth or nose? No Did you need to seek medical attention at a hospital or doctor's office? No When did it last happen? If all above answers are "NO", may proceed with cephalosporin use.     Patient Measurements: Height: 5\' 8"  (172.7 cm) Weight: 145 lb (65.8 kg) IBW/kg (Calculated) : 68.4 Heparin Dosing Weight: 65.8 kg  Vital Signs: Temp: 98.3 F (36.8 C) (08/02 1458) Temp Source: Oral (08/02 1458) BP: 149/101 (08/02 1900) Pulse Rate: 81 (08/02 1900)  Labs: Recent Labs    09/07/18 1504 09/07/18 1605 09/07/18 1804  HGB 11.4*  --   --   HCT 32.7*  --   --   PLT 280  --   --   CREATININE  --  1.11  --   TROPONINIHS  --  23* 92*    Estimated Creatinine Clearance: 66.7 mL/min (by C-G formula based on SCr of 1.11 mg/dL).   Medical History: Past Medical History:  Diagnosis Date  . Cancer ([place])   . Hypertension     Medications:  (Not in a hospital admission)  Scheduled:  . heparin  4,000 Units Intravenous Once   Infusions:  . heparin     PRN:  Anti-infectives (From admission, onward)   None      Assessment: Pharmacy consulted to dose Heparin Drip on 58yo patient admitted with right-sided severe chest pain radiating to the right arm. Had a syncopal episode earlier where he lost concioiusness completely. Patient woke and started having severe chest pain again. Was admitted in May with NSTEMI. Stress test was negative. Patient was discharged with HTN and antihypertensives. Baseline labs have been ordered and results are pending. Patient has no prior history of anticoagulant use, therefore will proceed with immediate  therapy.  Per floor nurse, medication was delayed because initial blood draw by lab was insufficient due to clotting, which required a redraw. Nurse was waiting for lab to redraw, therefore delaying start of medication.   Goal of Therapy:  Heparin level 0.3-0.7 units/ml Monitor platelets by anticoagulation protocol: Yes   Plan:  Give 4000 units bolus x 1 Start heparin infusion at 800 units/hr Check anti-Xa level in 6 hours and daily while on heparin Continue to monitor H&H and platelets  Jomar Denz A Jessey Huyett 09/07/2018,7:46 PM

## 2018-09-07 NOTE — ED Provider Notes (Signed)
[facility] Emergency Department Provider Note       Time seen: ----------------------------------------- 3:05 PM on 09/07/2018 -----------------------------------------   I have reviewed the triage vital signs and the nursing notes.  HISTORY   Chief Complaint Loss of Consciousness    HPI Darrell [name] is a 58 y.o. male with a history of cancer and hypertension who presents to the ED for syncope.  Patient states he was at work and had a syncopal episode.  He had to sit in front of a fan to feel better.  He had sudden onset right-sided chest pain that radiated down his right arm.  Was recently admitted to the hospital for same with no specific etiology being given.  He denies any current complaints.  Past Medical History:  Diagnosis Date  . Cancer ([place])   . Hypertension     Patient Active Problem List   Diagnosis Date Noted  . Chest pain 06/07/2018    Past Surgical History:  Procedure Laterality Date  . ROTATOR CUFF REPAIR      Allergies Penicillins  Social History Social History   Tobacco Use  . Smoking status: Current Every Day Smoker    Types: Cigars  . Smokeless tobacco: Never Used  . Tobacco comment: 2 per day  Substance Use Topics  . Alcohol use: Yes    Alcohol/week: 3.0 standard drinks    Types: 3 Cans of beer per week    Comment: daily  . Drug use: Not on file   Review of Systems Constitutional: Negative for fever. Cardiovascular: Positive for recent chest pain, syncope Respiratory: Negative for shortness of breath. Gastrointestinal: Negative for abdominal pain, vomiting and diarrhea. Musculoskeletal: Negative for back pain. Skin: Negative for rash. Neurological: Negative for headaches, focal weakness or numbness.  All systems negative/normal/unremarkable except as stated in the HPI  ____________________________________________   PHYSICAL EXAM:  VITAL SIGNS: ED Triage Vitals  Enc Vitals Group     BP --    Pulse Rate 09/07/18 1458 84     Resp 09/07/18 1458 15     Temp 09/07/18 1458 98.3 F (36.8 C)     Temp Source 09/07/18 1458 Oral     SpO2 09/07/18 1458 99 %     Weight 09/07/18 1500 145 lb (65.8 kg)     Height 09/07/18 1500 5\' 8"  (1.727 m)     Head Circumference --      Peak Flow --      Pain Score 09/07/18 1500 0     Pain Loc --      Pain Edu? --      Excl. in [place]? --    Constitutional: Alert and oriented. Well appearing and in no distress. Eyes: Conjunctivae are normal. Normal extraocular movements. ENT      Head: Normocephalic and atraumatic.      Nose: No congestion/rhinnorhea.      Mouth/Throat: Mucous membranes are moist.      Neck: No stridor. Cardiovascular: Normal rate, regular rhythm. No murmurs, rubs, or gallops. Respiratory: Normal respiratory effort without tachypnea nor retractions. Breath sounds are clear and equal bilaterally. No wheezes/rales/rhonchi. Gastrointestinal: Soft and nontender. Normal bowel sounds Musculoskeletal: Nontender with normal range of motion in extremities. No lower extremity tenderness nor edema. Neurologic:  Normal speech and language. No gross focal neurologic deficits are appreciated.  Skin:  Skin is warm, dry and intact. No rash noted. Psychiatric: Mood and affect are normal. Speech and behavior are normal.  ____________________________________________  EKG: Interpreted by me.  Sinus rhythm with a rate of 81 bpm, normal PR interval, normal QRS, normal QT  ____________________________________________  ED COURSE:  As part of my medical decision making, I reviewed the following data within the [place] History obtained from family if available, nursing notes, old chart and ekg, as well as notes from prior ED visits. Patient presented for syncope with preceding chest pain, we will assess with labs and imaging as indicated at this time.   Procedures  Darrell [name] was evaluated in Emergency Department on 09/07/2018 for  the symptoms described in the history of present illness. He was evaluated in the context of the global COVID-19 pandemic, which necessitated consideration that the patient might be at risk for infection with the SARS-CoV-2 virus that causes COVID-19. Institutional protocols and algorithms that pertain to the evaluation of patients at risk for COVID-19 are in a state of rapid change based on information released by regulatory bodies including the CDC and federal and state organizations. These policies and algorithms were followed during the patient's care in the ED.  ____________________________________________   LABS (pertinent positives/negatives)  Labs Reviewed  CBC - Abnormal; Notable for the following components:      Result Value   RBC 3.50 (*)    Hemoglobin 11.4 (*)    HCT 32.7 (*)    All other components within normal limits  BASIC METABOLIC PANEL - Abnormal; Notable for the following components:   Glucose, Bld 104 (*)    BUN 26 (*)    Calcium 8.6 (*)    All other components within normal limits  TROPONIN I (HIGH SENSITIVITY) - Abnormal; Notable for the following components:   Troponin I (High Sensitivity) 23 (*)    All other components within normal limits  TROPONIN I (HIGH SENSITIVITY) - Abnormal; Notable for the following components:   Troponin I (High Sensitivity) 92 (*)    All other components within normal limits  ETHANOL   CRITICAL CARE Performed by: Laurence Aly   Total critical care time: 30 minutes  Critical care time was exclusive of separately billable procedures and treating other patients.  Critical care was necessary to treat or prevent imminent or life-threatening deterioration.  Critical care was time spent personally by me on the following activities: development of treatment plan with patient and/or surrogate as well as nursing, discussions with consultants, evaluation of patient's response to treatment, examination of patient, obtaining history  from patient or surrogate, ordering and performing treatments and interventions, ordering and review of laboratory studies, ordering and review of radiographic studies, pulse oximetry and re-evaluation of patient's condition.  RADIOLOGY  Chest x-ray Did not reveal any acute process ____________________________________________   DIFFERENTIAL DIAGNOSIS   Orthostasis, dehydration, electrolyte abnormality, arrhythmia, MI, alcohol abuse  FINAL ASSESSMENT AND PLAN  Syncope, chest pain, elevated troponin   Plan: The patient had presented for syncope that occurred at work. Patient's labs initially revealed a mildly elevated troponin which exactly quadrupled upon repeat at 2 hours. Patient's imaging did not reveal any acute process.  Patient will likely need cardiac catheterization as he had a normal nuclear stress test in the last 3 months.  His syncope was preceded by acute onset severe chest pain that radiated down his right arm.  I will discuss with the hospitalist for admission.   Laurence Aly, MD    Note: This note was generated in part or whole with voice recognition software. Voice recognition is usually quite accurate but there are transcription errors that  can and very often do occur. I apologize for any typographical errors that were not detected and corrected.     Earleen Newport, MD 09/07/18 [phone]

## 2018-09-07 NOTE — H&P (Signed)
Darrell [name]    MR#:  [id number]  DATE OF BIRTH:  1960/07/13  DATE OF ADMISSION:  09/07/2018  PRIMARY CARE PHYSICIAN: Patient, No Pcp Per   REQUESTING/REFERRING PHYSICIAN: Dr. Jimmye Norman  CHIEF COMPLAINT:   Chest pain and loss of consciousness HISTORY OF PRESENT ILLNESS:  Darrell [name]  is a 58 y.o. male with a known history of hypertension comes to the emergency room after he had experience right-sided severe chest pain radiating to the right arm and had a syncopal episode where he lost consciousness completely. Patient woke up and started having chest pain again. Came to the emergency room had another episode of chest pain. His first troponin was 26 repeat was 92. Patient is being admitted for chest pain rule out MI heparin drip will be started.  He was here in May admitted with non-STEMI. Stress test was done and was negative. Patient was diagnosed with hypertension and discharge on antihypertensives which he is taking it on a regular basis.  PAST MEDICAL HISTORY:   Past Medical History:  Diagnosis Date  . Cancer ([place])   . Hypertension     PAST SURGICAL HISTOIRY:   Past Surgical History:  Procedure Laterality Date  . ROTATOR CUFF REPAIR      SOCIAL HISTORY:   Social History   Tobacco Use  . Smoking status: Current Every Day Smoker    Types: Cigars  . Smokeless tobacco: Never Used  . Tobacco comment: 2 per day  Substance Use Topics  . Alcohol use: Yes    Alcohol/week: 3.0 standard drinks    Types: 3 Cans of beer per week    Comment: daily    FAMILY HISTORY:  History reviewed. No pertinent family history.  DRUG ALLERGIES:   Allergies  Allergen Reactions  . Penicillins Hives    Did it involve swelling of the face/tongue/throat, SOB, or low BP? No Did it involve sudden or severe rash/hives, skin peeling, or any reaction on the inside of your mouth or nose? No Did you need to seek  medical attention at a hospital or doctor's office? No When did it last happen? If all above answers are "NO", may proceed with cephalosporin use.     REVIEW OF SYSTEMS:  Review of Systems  Constitutional: Negative for chills, fever and weight loss.  HENT: Negative for ear discharge, ear pain and nosebleeds.   Eyes: Negative for blurred vision, pain and discharge.  Respiratory: Negative for sputum production, shortness of breath, wheezing and stridor.   Cardiovascular: Positive for chest pain. Negative for palpitations, orthopnea and PND.  Gastrointestinal: Negative for abdominal pain, diarrhea, nausea and vomiting.  Genitourinary: Negative for frequency and urgency.  Musculoskeletal: Negative for back pain and joint pain.  Neurological: Negative for sensory change, speech change, focal weakness and weakness.  Psychiatric/Behavioral: Negative for depression and hallucinations. The patient is not nervous/anxious.      MEDICATIONS AT HOME:   Prior to Admission medications   Medication Sig Start Date End Date Taking? Authorizing Provider  amLODipine (NORVASC) 5 MG tablet Take 1 tablet (5 mg total) by mouth daily. 06/10/18 09/07/18 Yes Sainani, Belia Heman, MD  losartan-hydrochlorothiazide (HYZAAR) 50-12.5 MG tablet Take 1 tablet by mouth daily. 06/09/18 09/07/18 Yes Sainani, Belia Heman, MD      VITAL SIGNS:  Blood pressure (!) 149/101, pulse 81, temperature 98.3 F (36.8 C), temperature source Oral, resp. rate (!) 21, height 5\' 8"  (1.727 m), weight  65.8 kg, SpO2 100 %.  PHYSICAL EXAMINATION:  GENERAL:  58 y.o.-year-old patient lying in the bed with no acute distress.  EYES: Pupils equal, round, reactive to light and accommodation. No scleral icterus. Extraocular muscles intact.  HEENT: Head atraumatic, normocephalic. Oropharynx and nasopharynx clear.  NECK:  Supple, no jugular venous distention. No thyroid enlargement, no tenderness.  LUNGS: Normal breath sounds bilaterally, no wheezing,  rales,rhonchi or crepitation. No use of accessory muscles of respiration.  CARDIOVASCULAR: S1, S2 normal. No murmurs, rubs, or gallops.  ABDOMEN: Soft, nontender, nondistended. Bowel sounds present. No organomegaly or mass.  EXTREMITIES: No pedal edema, cyanosis, or clubbing.  NEUROLOGIC: Cranial nerves II through XII are intact. Muscle strength 5/5 in all extremities. Sensation intact. Gait not checked.  PSYCHIATRIC: The patient is alert and oriented x 3.  SKIN: No obvious rash, lesion, or ulcer.   LABORATORY PANEL:   CBC Recent Labs  Lab 09/07/18 1504  WBC 6.1  HGB 11.4*  HCT 32.7*  PLT 280   ------------------------------------------------------------------------------------------------------------------  Chemistries  Recent Labs  Lab 09/07/18 1605  NA 140  K 3.5  CL 109  CO2 22  GLUCOSE 104*  BUN 26*  CREATININE 1.11  CALCIUM 8.6*   ------------------------------------------------------------------------------------------------------------------  Cardiac Enzymes No results for input(s): TROPONINI in the last 168 hours. ------------------------------------------------------------------------------------------------------------------  RADIOLOGY:  Dg Chest Port 1 View  Result Date: 09/07/2018 CLINICAL DATA:  Syncopal episode today.  Chest pain. EXAM: PORTABLE CHEST 1 VIEW COMPARISON:  Jun 07, 2018 FINDINGS: The heart size and mediastinal contours are within normal limits. Mild linear atelectasis of left lung base is noted. There is no focal pneumonia or pleural effusion. The visualized skeletal structures are stable. IMPRESSION: Mild atelectasis of left lung base.  No focal pneumonia. Electronically Signed   By: Abelardo Diesel M.D.   On: 09/07/2018 15:17    EKG:   SR-- no ST elevation her depression IMPRESSION AND PLAN:   Darrell [name]  is a 58 y.o. male with a known history of hypertension comes to the emergency room after he had experience right-sided severe chest  pain radiating to the right arm and had a syncopal episode where he lost consciousness completely. Patient woke up and started having chest pain again.   1. Chest pain recurrent with loss of consciousness/syncopal episode -risk factor hypertension and alcohol use -admit to telemetry -aspirin, nitro, beta blockers, losartan/hydrochlorothiazide -heparin drip -cardiology consultation with Dr. Nehemiah Massed--- secure message sent -will keep patient NPO anticipating cardiac cath-- which will be beneficial given patient's syncopal episode and chest pain -rule out arrhythmia  2. Hypertension continue home meds  3. Alcohol use -patient denies DTs. Watch for withdrawal symptoms. Advised to abstain from alcohol  4. DVT prophylaxis patient already on heparin drip   All the records are reviewed and case discussed with ED provider.   CODE STATUS: full  TOTAL TIME TAKING CARE OF THIS PATIENT: *50* minutes.    Fritzi Mandes M.D on 09/07/2018 at 7:40 PM  Between 7am to 6pm - Pager - [phone]  After 6pm go to [email or web address] - password [email or web address]  Office  [phone]  CC: Primary care physician; Patient, No Pcp Per

## 2018-09-07 NOTE — ED Notes (Signed)
ED TO INPATIENT HANDOFF REPORT  ED Nurse Name and Phone #: Takerra Lupinacci [phone]  S Name/Age/Gender Darrell [name] 58 y.o. male Room/Bed: ED08A/ED08A  Code Status   Code Status: Prior  Home/SNF/Other Home Patient oriented to: self, place, time and situation Is this baseline? Yes   Triage Complete: Triage complete  Chief Complaint weakness  Triage Note Per ems pt was at work and had a syncopal episode - sat in front of the fan to feel better. vss   Allergies Allergies  Allergen Reactions  . Penicillins Hives    Did it involve swelling of the face/tongue/throat, SOB, or low BP? No Did it involve sudden or severe rash/hives, skin peeling, or any reaction on the inside of your mouth or nose? No Did you need to seek medical attention at a hospital or doctor's office? No When did it last happen? If all above answers are "NO", may proceed with cephalosporin use.     Level of Care/Admitting Diagnosis ED Disposition    ED Disposition Condition [facility] Area: [place] [[id number]]  Level of Care: Telemetry [5]  Covid Evaluation: Asymptomatic Screening Protocol (No Symptoms)  Diagnosis: Chest pain [767209]  Admitting Physician: Odessa Fleming  Attending Physician: Fritzi Mandes [[id number]]  PT Class (Do Not Modify): Observation [104]  PT Acc Code (Do Not Modify): Observation [10022]       B Medical/Surgery History Past Medical History:  Diagnosis Date  . Cancer ([place])   . Hypertension    Past Surgical History:  Procedure Laterality Date  . ROTATOR CUFF REPAIR       A IV Location/Drains/Wounds Patient Lines/Drains/Airways Status   Active Line/Drains/Airways    Name:   Placement date:   Placement time:   Site:   Days:   Peripheral IV 09/07/18 Right Antecubital   09/07/18    1459    Antecubital   less than 1          Intake/Output Last 24 hours No intake or output data in the 24 hours ending 09/07/18  2056  Labs/Imaging Results for orders placed or performed during the hospital encounter of 09/07/18 (from the past 48 hour(s))  CBC     Status: Abnormal   Collection Time: 09/07/18  3:04 PM  Result Value Ref Range   WBC 6.1 4.0 - 10.5 K/uL   RBC 3.50 (L) 4.22 - 5.81 MIL/uL   Hemoglobin 11.4 (L) 13.0 - 17.0 g/dL   HCT 32.7 (L) 39.0 - 52.0 %   MCV 93.4 80.0 - 100.0 fL   MCH 32.6 26.0 - 34.0 pg   MCHC 34.9 30.0 - 36.0 g/dL   RDW 14.1 11.5 - 15.5 %   Platelets 280 150 - 400 K/uL   nRBC 0.0 0.0 - 0.2 %    Comment: Performed at [facility], [address] [place], [place] [address]  Ethanol     Status: None   Collection Time: 09/07/18  3:04 PM  Result Value Ref Range   Alcohol, Ethyl (B) <10 <10 mg/dL    Comment: (NOTE) Lowest detectable limit for serum alcohol is 10 mg/dL. For medical purposes only. Performed at [facility], [address] [place], [place] [address]   Basic metabolic panel     Status: Abnormal   Collection Time: 09/07/18  4:05 PM  Result Value Ref Range   Sodium 140 135 - 145 mmol/L   Potassium 3.5 3.5 - 5.1 mmol/L   Chloride 109 98 -  111 mmol/L   CO2 22 22 - 32 mmol/L   Glucose, Bld 104 (H) 70 - 99 mg/dL   BUN 26 (H) 6 - 20 mg/dL   Creatinine, Ser 1.11 0.61 - 1.24 mg/dL   Calcium 8.6 (L) 8.9 - 10.3 mg/dL   GFR calc non Af Amer >60 >60 mL/min   GFR calc Af Amer >60 >60 mL/min   Anion gap 9 5 - 15    Comment: Performed at [facility], [place], [place] [address]  Troponin I (High Sensitivity)     Status: Abnormal   Collection Time: 09/07/18  4:05 PM  Result Value Ref Range   Troponin I (High Sensitivity) 23 (H) <18 ng/L    Comment: (NOTE) Elevated high sensitivity troponin I (hsTnI) values and significant  changes across serial measurements may suggest ACS but many other  chronic and acute conditions are known to elevate hsTnI results.  Refer to the "Links" section for chest pain algorithms and additional   guidance. Performed at [facility], [place], [place] [address]   Troponin I (High Sensitivity)     Status: Abnormal   Collection Time: 09/07/18  6:04 PM  Result Value Ref Range   Troponin I (High Sensitivity) 92 (H) <18 ng/L    Comment: READ BACK AND VERIFIED WITH [name] @1837  ON 09/07/2018 BY FMW (NOTE) Elevated high sensitivity troponin I (hsTnI) values and significant  changes across serial measurements may suggest ACS but many other  chronic and acute conditions are known to elevate hsTnI results.  Refer to the "Links" section for chest pain algorithms and additional  guidance. Performed at [facility], [address] [place], [place] [address]    Dg Chest Port 1 View  Result Date: 09/07/2018 CLINICAL DATA:  Syncopal episode today.  Chest pain. EXAM: PORTABLE CHEST 1 VIEW COMPARISON:  Jun 07, 2018 FINDINGS: The heart size and mediastinal contours are within normal limits. Mild linear atelectasis of left lung base is noted. There is no focal pneumonia or pleural effusion. The visualized skeletal structures are stable. IMPRESSION: Mild atelectasis of left lung base.  No focal pneumonia. Electronically Signed   By: Abelardo Diesel M.D.   On: 09/07/2018 15:17    Pending Labs Unresulted Labs (From admission, onward)    Start     Ordered   09/07/18 2006  APTT  Add-on,   AD     09/07/18 2006   09/07/18 2006  Protime-INR  Add-on,   AD     09/07/18 2006          Vitals/Pain Today's Vitals   09/07/18 1700 09/07/18 1730 09/07/18 1900 09/07/18 2000  BP: (!) 135/93 (!) 146/97 (!) 149/101 (!) 157/104  Pulse: 77 81 81 73  Resp: (!) 23 (!) 23 (!) 21 (!) 23  Temp:      TempSrc:      SpO2: 99% 99% 100% 100%  Weight:      Height:      PainSc:        Isolation Precautions No active isolations  Medications Medications  heparin bolus via infusion 4,000 Units (has no administration in time range)    Followed by  heparin ADULT infusion  100 units/mL (25000 units/228mL sodium chloride 0.45%) (has no administration in time range)  sodium chloride flush (NS) 0.9 % injection 3 mL (has no administration in time range)    Mobility walks Low fall risk   Focused Assessments Cardiac Assessment  Handoff:    Lab Results  Component Value Date   TROPONINI 0.82 ([place]) 06/07/2018   No results found for: DDIMER Does the Patient currently have chest pain? No     R Recommendations: See Admitting Provider Note  Report given to:   Additional Notes:

## 2018-09-08 ENCOUNTER — Encounter
Admission: EM | Disposition: A | Discharge status: Home / Self Care | Payer: Self-pay | Source: Home / Self Care | Attending: Emergency Medicine

## 2018-09-08 ENCOUNTER — Encounter: Payer: Self-pay | Admitting: Internal Medicine

## 2018-09-08 HISTORY — PX: LEFT HEART CATH AND CORONARY ANGIOGRAPHY: CATH118249

## 2018-09-08 LAB — CBC
HCT: 32.4 % — ABNORMAL LOW (ref 39.0–52.0)
Hemoglobin: 11.3 g/dL — ABNORMAL LOW (ref 13.0–17.0)
MCH: 32.8 pg (ref 26.0–34.0)
MCHC: 34.9 g/dL (ref 30.0–36.0)
MCV: 93.9 fL (ref 80.0–100.0)
Platelets: 291 10*3/uL (ref 150–400)
RBC: 3.45 MIL/uL — ABNORMAL LOW (ref 4.22–5.81)
RDW: 14.4 % (ref 11.5–15.5)
WBC: 7.5 10*3/uL (ref 4.0–10.5)
nRBC: 0 % (ref 0.0–0.2)

## 2018-09-08 LAB — HEPARIN LEVEL (UNFRACTIONATED): Heparin Unfractionated: 0.43 IU/mL (ref 0.30–0.70)

## 2018-09-08 SURGERY — LEFT HEART CATH AND CORONARY ANGIOGRAPHY
Anesthesia: Moderate Sedation

## 2018-09-08 MED ORDER — FENTANYL CITRATE (PF) 100 MCG/2ML IJ SOLN
INTRAMUSCULAR | Status: AC
  Filled 2018-09-08: qty 2

## 2018-09-08 MED ORDER — NICOTINE 21 MG/24HR TD PT24: 21.0000 mg | MEDICATED_PATCH | Freq: Every day | TRANSDERMAL | 0 refills | Status: AC

## 2018-09-08 MED ORDER — METOPROLOL TARTRATE 25 MG PO TABS: 25.0000 mg | ORAL_TABLET | Freq: Two times a day (BID) | ORAL | 0 refills | Status: DC

## 2018-09-08 MED ORDER — VERAPAMIL HCL 2.5 MG/ML IV SOLN
INTRAVENOUS | Status: DC | PRN
  Administered 2018-09-08: 2.5 mg via INTRA_ARTERIAL

## 2018-09-08 MED ORDER — MIDAZOLAM HCL 2 MG/2ML IJ SOLN
INTRAMUSCULAR | Status: DC | PRN
  Administered 2018-09-08: 1 mg via INTRAVENOUS

## 2018-09-08 MED ORDER — HEPARIN (PORCINE) IN NACL 1000-0.9 UT/500ML-% IV SOLN
INTRAVENOUS | Status: DC | PRN
  Administered 2018-09-08: 500 mL

## 2018-09-08 MED ORDER — HEPARIN SODIUM (PORCINE) 1000 UNIT/ML IJ SOLN
INTRAMUSCULAR | Status: DC | PRN
  Administered 2018-09-08: 3200 [IU] via INTRAVENOUS

## 2018-09-08 MED ORDER — IOHEXOL 300 MG/ML  SOLN
INTRAMUSCULAR | Status: DC | PRN
  Administered 2018-09-08: 09:00:00 205 mL via INTRA_ARTERIAL

## 2018-09-08 MED ORDER — HEPARIN (PORCINE) IN NACL 1000-0.9 UT/500ML-% IV SOLN
INTRAVENOUS | Status: AC
  Filled 2018-09-08: qty 1000

## 2018-09-08 MED ORDER — ASPIRIN 81 MG PO TBEC: 81.0000 mg | DELAYED_RELEASE_TABLET | Freq: Every day | ORAL | 0 refills | Status: AC

## 2018-09-08 MED ORDER — HEPARIN SODIUM (PORCINE) 1000 UNIT/ML IJ SOLN
INTRAMUSCULAR | Status: AC
  Filled 2018-09-08: qty 1

## 2018-09-08 MED ORDER — MIDAZOLAM HCL 2 MG/2ML IJ SOLN
INTRAMUSCULAR | Status: AC
  Filled 2018-09-08: qty 2

## 2018-09-08 MED ORDER — ATORVASTATIN CALCIUM 40 MG PO TABS: 40.0000 mg | ORAL_TABLET | Freq: Every day | ORAL | 0 refills | Status: AC

## 2018-09-08 MED ORDER — FENTANYL CITRATE (PF) 100 MCG/2ML IJ SOLN
INTRAMUSCULAR | Status: DC | PRN
  Administered 2018-09-08 (×2): 25 ug via INTRAVENOUS

## 2018-09-08 MED ORDER — VERAPAMIL HCL 2.5 MG/ML IV SOLN
INTRAVENOUS | Status: AC
  Filled 2018-09-08: qty 2

## 2018-09-08 SURGICAL SUPPLY — 9 items
CATH 5F 110X4 TIG (CATHETERS) ×2 IMPLANT
CATH INFINITI 5 FR 3DRC (CATHETERS) ×2 IMPLANT
CATH INFINITI 5 FR MPA2 (CATHETERS) ×2 IMPLANT
CATH INFINITI JR4 5F (CATHETERS) ×2 IMPLANT
DEVICE RAD TR BAND REGULAR (VASCULAR PRODUCTS) ×2 IMPLANT
GLIDESHEATH SLEND SS 6F .021 (SHEATH) ×2 IMPLANT
KIT MANI 3VAL PERCEP (MISCELLANEOUS) ×3 IMPLANT
PACK CARDIAC CATH (CUSTOM PROCEDURE TRAY) ×3 IMPLANT
WIRE ROSEN-J .035X260CM (WIRE) ×2 IMPLANT

## 2018-09-08 NOTE — Progress Notes (Signed)
[facility] Cardiology [facility] Encounter Note  Patient: Darrell [name] / Admit Date: 09/07/2018 / Date of Encounter: 09/08/2018, 8:42 AM   Subjective: Patient has resolution of chest discomfort overnight.  No evidence of myocardial infarction despite slight increase in troponin level.  EKG remains normal. Cardiac catheterization showing minimal coronary atherosclerosis without evidence of significant stenosis requiring further intervention  Review of Systems: Positive for: None Negative for: Vision change, hearing change, syncope, dizziness, nausea, vomiting,diarrhea, bloody stool, stomach pain, cough, congestion, diaphoresis, urinary frequency, urinary pain,skin lesions, skin rashes Others previously listed  Objective: Telemetry: Normal sinus rhythm Physical Exam: Blood pressure (!) 142/95, pulse 64, temperature 98 F (36.7 C), temperature source Oral, resp. rate 15, height 5\' 8"  (1.727 m), weight 63.1 kg, SpO2 100 %. Body mass index is 21.15 kg/m. General: Well developed, well nourished, in no acute distress. Head: Normocephalic, atraumatic, sclera non-icteric, no xanthomas, nares are without discharge. Neck: No apparent masses Lungs: Normal respirations with no wheezes, no rhonchi, no rales , no crackles   Heart: Regular rate and rhythm, normal S1 S2, no murmur, no rub, no gallop, PMI is normal size and placement, carotid upstroke normal without bruit, jugular venous pressure normal Abdomen: Soft, non-tender, non-distended with normoactive bowel sounds. No hepatosplenomegaly. Abdominal aorta is normal size without bruit Extremities: No edema, no clubbing, no cyanosis, no ulcers,  Peripheral: 2+ radial, 2+ femoral, 2+ dorsal pedal pulses Neuro: Alert and oriented. Moves all extremities spontaneously. Psych:  Responds to questions appropriately with a normal affect.   Intake/Output Summary (Last 24 hours) at 09/08/2018 0842 Last data filed at 09/08/2018 0723 Gross per 24 hour  Intake  240.28 ml  Output 375 ml  Net -134.72 ml    Inpatient Medications:  . [MAR Hold] aspirin EC  81 mg Oral Daily  . [MAR Hold] atorvastatin  40 mg Oral q1800  . [MAR Hold] losartan  50 mg Oral Daily   And  . [MAR Hold] hydrochlorothiazide  12.5 mg Oral Daily  . [MAR Hold] metoprolol tartrate  25 mg Oral BID  . [MAR Hold] sodium chloride flush  3 mL Intravenous Q12H   Infusions:  . sodium chloride    . sodium chloride 1 mL/kg/hr (09/08/18 1517)  . heparin Stopped (09/08/18 0722)    Labs: Recent Labs    09/07/18 1605  NA 140  K 3.5  CL 109  CO2 22  GLUCOSE 104*  BUN 26*  CREATININE 1.11  CALCIUM 8.6*   No results for input(s): AST, ALT, ALKPHOS, BILITOT, PROT, ALBUMIN in the last 72 hours. Recent Labs    09/07/18 1504  WBC 6.1  HGB 11.4*  HCT 32.7*  MCV 93.4  PLT 280   No results for input(s): CKTOTAL, CKMB, TROPONINI in the last 72 hours. Invalid input(s): POCBNP No results for input(s): HGBA1C in the last 72 hours.   Weights: Filed Weights   09/07/18 2200 09/08/18 0428 09/08/18 0711  Weight: 63.1 kg 63.1 kg 63.1 kg     Radiology/Studies:  Dg Chest Port 1 View  Result Date: 09/07/2018 CLINICAL DATA:  Syncopal episode today.  Chest pain. EXAM: PORTABLE CHEST 1 VIEW COMPARISON:  Jun 07, 2018 FINDINGS: The heart size and mediastinal contours are within normal limits. Mild linear atelectasis of left lung base is noted. There is no focal pneumonia or pleural effusion. The visualized skeletal structures are stable. IMPRESSION: Mild atelectasis of left lung base.  No focal pneumonia. Electronically Signed   By: Abelardo Diesel M.D.   On:  09/07/2018 15:17     Assessment and Recommendation  58 y.o. male with known tobacco abuse hypertension hyperlipidemia having acute chest discomfort and elevation of troponin without evidence of myocardial infarction and cardiac catheterization showing minimal atherosclerosis and no current evidence of acute coronary syndrome 1.  Further  supportive care of chest discomfort and isosorbide as necessary for relief of chest discomfort of unknown etiology 2.  No further cardiac diagnostics necessary at this time due to no evidence of critical coronary atherosclerosis 3.  High intensity cholesterol therapy due to coronary atherosclerosis 4.  Hypertension control with goal systolic blood pressure below 130 mm 5.  Patient is counseled on the discontinuation of tobacco abuse 6.  Okay for ambulation and follow for further significant symptoms and okay for discharge home from cardiac standpoint with follow-up in 1 to 2 weeks for further adjustments as above  Signed, Serafina Royals M.D. FACC

## 2018-09-08 NOTE — Progress Notes (Signed)
IVs and tele removed from patient. Discharge instructions given to patient. Verbalized understanding. No acute distress at this time. Wife to transport patient home.

## 2018-09-08 NOTE — Consult Note (Signed)
ANTICOAGULATION CONSULT NOTE - Initial Consult  Pharmacy Consult for Heparin Dosing Indication: chest pain/ACS  Allergies  Allergen Reactions  . Penicillins Hives    Did it involve swelling of the face/tongue/throat, SOB, or low BP? No Did it involve sudden or severe rash/hives, skin peeling, or any reaction on the inside of your mouth or nose? No Did you need to seek medical attention at a hospital or doctor's office? No When did it last happen? If all above answers are "NO", may proceed with cephalosporin use.     Patient Measurements: Height: 5\' 8"  (172.7 cm) Weight: 139 lb 1.6 oz (63.1 kg) IBW/kg (Calculated) : 68.4 Heparin Dosing Weight: 65.8 kg  Vital Signs: Temp: 98.7 F (37.1 C) (08/03 0428) Temp Source: Oral (08/03 0428) BP: 143/82 (08/03 0428) Pulse Rate: 70 (08/03 0428)  Labs: Recent Labs    09/07/18 1504 09/07/18 1605 09/07/18 1804 09/07/18 2210 09/08/18 0500  HGB 11.4*  --   --   --   --   HCT 32.7*  --   --   --   --   PLT 280  --   --   --   --   APTT  --   --   --  29  --   LABPROT  --   --   --  12.7  --   INR  --   --   --  1.0  --   HEPARINUNFRC  --   --   --   --  0.43  CREATININE  --  1.11  --   --   --   TROPONINIHS  --  23* 92*  --   --     Estimated Creatinine Clearance: 64 mL/min (by C-G formula based on SCr of 1.11 mg/dL).   Medical History: Past Medical History:  Diagnosis Date  . Cancer ([place])   . Hypertension     Medications:  Medications Prior to Admission  Medication Sig Dispense Refill Last Dose  . amLODipine (NORVASC) 5 MG tablet Take 1 tablet (5 mg total) by mouth daily. 30 tablet 1 09/07/2018 at 0830  . losartan-hydrochlorothiazide (HYZAAR) 50-12.5 MG tablet Take 1 tablet by mouth daily. 30 tablet 1 09/07/2018 at 0830   Scheduled:  . [START ON 09/09/2018] aspirin EC  81 mg Oral Daily  . atorvastatin  40 mg Oral q1800  . losartan  50 mg Oral Daily   And  . hydrochlorothiazide  12.5 mg Oral Daily  . metoprolol  tartrate  25 mg Oral BID  . sodium chloride flush  3 mL Intravenous Q12H   Infusions:  . sodium chloride    . sodium chloride 3 mL/kg/hr (09/08/18 0520)   Followed by  . sodium chloride 1 mL/kg/hr (09/08/18 9563)  . heparin 800 Units/hr (09/08/18 0300)   PRN:  Anti-infectives (From admission, onward)   None      Assessment: Pharmacy consulted to dose Heparin Drip on 58yo patient admitted with right-sided severe chest pain radiating to the right arm. Had a syncopal episode earlier where he lost concioiusness completely. Patient woke and started having severe chest pain again. Was admitted in May with NSTEMI. Stress test was negative. Patient was discharged with HTN and antihypertensives. Baseline labs have been ordered and results are pending. Patient has no prior history of anticoagulant use, therefore will proceed with immediate therapy.  Per floor nurse, medication was delayed because initial blood draw by lab was insufficient due to clotting, which required a redraw.  Nurse was waiting for lab to redraw, therefore delaying start of medication.   Goal of Therapy:  Heparin level 0.3-0.7 units/ml Monitor platelets by anticoagulation protocol: Yes   Plan:  08/03 @ 0500 HL 0.43 therapeutic. Will continue current rate and will recheck @ 1100, CBC check at 1200, will continue to monitor.  Tobie Lords, PharmD, BCPS Clinical Pharmacist 09/08/2018,6:45 AM

## 2018-09-08 NOTE — Plan of Care (Signed)
VSS;no complaints voiced this shift.

## 2018-09-08 NOTE — Discharge Summary (Signed)
[place] [facility] [place] NAME: Darrell [name]    MR#:  [id number]  DATE OF BIRTH:  October 07, 1960  DATE OF ADMISSION:  09/07/2018 ADMITTING PHYSICIAN: Fritzi Mandes, MD  DATE OF DISCHARGE: 09/08/2018 PRIMARY CARE PHYSICIAN: Patient, No Pcp Per    ADMISSION DIAGNOSIS:  Syncope, unspecified syncope type [R55] Chest pain, unspecified type [R07.9]  DISCHARGE DIAGNOSIS:  Active Problems:   Chest pain   SECONDARY DIAGNOSIS:   Past Medical History:  Diagnosis Date  . Cancer ([place])   . Hypertension     HOSPITAL COURSE:   58 year old male with essential hypertension and tobacco dependence who presented to emergency room due to chest pain.  1.  Atypical chest pain: Patient was evaluated by cardiology.  Due to minimal elevation of troponin patient underwent cardiac catheterization.  Cardiac catheterization showed minimal atherosclerosis and no current evidence of acute coronary syndrome.   High intensity cholesterol therapy is recommended due to coronary atherosclerosis.  2.  Essential hypertension: Patient will continue losartan/HCTZ with the addition of metoprolol.  3.Tobacco dependence: Patient is encouraged to quit smoking and willing to attempt to quit was assessed. Patient highly motivated.Counseling was provided for 4 minutes.   DISCHARGE CONDITIONS AND DIET:   Stable Cardiac diet  CONSULTS OBTAINED:  Treatment Team:  Corey Skains, MD  DRUG ALLERGIES:   Allergies  Allergen Reactions  . Penicillins Hives    Did it involve swelling of the face/tongue/throat, SOB, or low BP? No Did it involve sudden or severe rash/hives, skin peeling, or any reaction on the inside of your mouth or nose? No Did you need to seek medical attention at a hospital or doctor's office? No When did it last happen? If all above answers are "NO", may proceed with cephalosporin use.     DISCHARGE MEDICATIONS:   Allergies as of 09/08/2018    Reactions   Penicillins Hives   Did it involve swelling of the face/tongue/throat, SOB, or low BP? No Did it involve sudden or severe rash/hives, skin peeling, or any reaction on the inside of your mouth or nose? No Did you need to seek medical attention at a hospital or doctor's office? No When did it last happen? If all above answers are "NO", may proceed with cephalosporin use.      Medication List    STOP taking these medications   amLODipine 5 MG tablet Commonly known as: NORVASC     TAKE these medications   aspirin 81 MG EC tablet Take 1 tablet (81 mg total) by mouth daily. Start taking on: September 09, 2018   atorvastatin 40 MG tablet Commonly known as: LIPITOR Take 1 tablet (40 mg total) by mouth daily at 6 PM.   losartan-hydrochlorothiazide 50-12.5 MG tablet Commonly known as: HYZAAR Take 1 tablet by mouth daily.   metoprolol tartrate 25 MG tablet Commonly known as: LOPRESSOR Take 1 tablet (25 mg total) by mouth 2 (two) times daily.   nicotine 21 mg/24hr patch Commonly known as: Nicoderm CQ Place 1 patch (21 mg total) onto the skin daily.         Today   CHIEF COMPLAINT:  Doing well no chest pain   VITAL SIGNS:  Blood pressure (!) 143/89, pulse 65, temperature 98 F (36.7 C), temperature source Oral, resp. rate 17, height 5\' 8"  (1.727 m), weight 63.1 kg, SpO2 100 %.   REVIEW OF SYSTEMS:  Review of Systems  Constitutional: Negative.  Negative for chills, fever and malaise/fatigue.  HENT: Negative.  Negative for ear discharge, ear pain, hearing loss, nosebleeds and sore throat.   Eyes: Negative.  Negative for blurred vision and pain.  Respiratory: Negative.  Negative for cough, hemoptysis, shortness of breath and wheezing.   Cardiovascular: Negative.  Negative for chest pain, palpitations and leg swelling.  Gastrointestinal: Negative.  Negative for abdominal pain, blood in stool, diarrhea, nausea and vomiting.  Genitourinary: Negative.  Negative  for dysuria.  Musculoskeletal: Negative.  Negative for back pain.  Skin: Negative.   Neurological: Negative for dizziness, tremors, speech change, focal weakness, seizures and headaches.  Endo/Heme/Allergies: Negative.  Does not bruise/bleed easily.  Psychiatric/Behavioral: Negative.  Negative for depression, hallucinations and suicidal ideas.     PHYSICAL EXAMINATION:  GENERAL:  58 y.o.-year-old patient lying in the bed with no acute distress.  NECK:  Supple, no jugular venous distention. No thyroid enlargement, no tenderness.  LUNGS: Normal breath sounds bilaterally, no wheezing, rales,rhonchi  No use of accessory muscles of respiration.  CARDIOVASCULAR: S1, S2 normal. No murmurs, rubs, or gallops.  ABDOMEN: Soft, non-tender, non-distended. Bowel sounds present. No organomegaly or mass.  EXTREMITIES: No pedal edema, cyanosis, or clubbing.  PSYCHIATRIC: The patient is alert and oriented x 3.  SKIN: No obvious rash, lesion, or ulcer.   DATA REVIEW:   CBC Recent Labs  Lab 09/07/18 1504  WBC 6.1  HGB 11.4*  HCT 32.7*  PLT 280    Chemistries  Recent Labs  Lab 09/07/18 1605  NA 140  K 3.5  CL 109  CO2 22  GLUCOSE 104*  BUN 26*  CREATININE 1.11  CALCIUM 8.6*    Cardiac Enzymes No results for input(s): TROPONINI in the last 168 hours.  Microbiology Results  @MICRORSLT48 @  RADIOLOGY:  Dg Chest Port 1 View  Result Date: 09/07/2018 CLINICAL DATA:  Syncopal episode today.  Chest pain. EXAM: PORTABLE CHEST 1 VIEW COMPARISON:  Jun 07, 2018 FINDINGS: The heart size and mediastinal contours are within normal limits. Mild linear atelectasis of left lung base is noted. There is no focal pneumonia or pleural effusion. The visualized skeletal structures are stable. IMPRESSION: Mild atelectasis of left lung base.  No focal pneumonia. Electronically Signed   By: Abelardo Diesel M.D.   On: 09/07/2018 15:17      Allergies as of 09/08/2018      Reactions   Penicillins Hives   Did it  involve swelling of the face/tongue/throat, SOB, or low BP? No Did it involve sudden or severe rash/hives, skin peeling, or any reaction on the inside of your mouth or nose? No Did you need to seek medical attention at a hospital or doctor's office? No When did it last happen? If all above answers are "NO", may proceed with cephalosporin use.      Medication List    STOP taking these medications   amLODipine 5 MG tablet Commonly known as: NORVASC     TAKE these medications   aspirin 81 MG EC tablet Take 1 tablet (81 mg total) by mouth daily. Start taking on: September 09, 2018   atorvastatin 40 MG tablet Commonly known as: LIPITOR Take 1 tablet (40 mg total) by mouth daily at 6 PM.   losartan-hydrochlorothiazide 50-12.5 MG tablet Commonly known as: HYZAAR Take 1 tablet by mouth daily.   metoprolol tartrate 25 MG tablet Commonly known as: LOPRESSOR Take 1 tablet (25 mg total) by mouth 2 (two) times daily.   nicotine 21 mg/24hr patch Commonly known as: Nicoderm CQ Place 1 patch (  21 mg total) onto the skin daily.         Management plans discussed with the patient and he is in agreement. Stable for discharge home  Patient should follow up with cardiology  CODE STATUS:     Code Status Orders  (From admission, onward)         Start     Ordered   09/07/18 2206  Full code  Continuous     09/07/18 2205        Code Status History    Date Active Date Inactive Code Status Order ID Comments User Context   06/07/2018 1644 06/09/2018 1812 Full Code [id number]  Otila Back, MD ED   Advance Care Planning Activity      TOTAL TIME TAKING CARE OF THIS PATIENT: 38 minutes.    Note: This dictation was prepared with Dragon dictation along with smaller phrase technology. Any transcriptional errors that result from this process are unintentional.  Bettey Costa M.D on 09/08/2018 at 10:30 AM  Between 7am to 6pm - Pager - [phone] After 6pm go to [email or web address] - password  [facility]  Office  [phone]  CC: Primary care physician; Patient, No Pcp Per

## 2018-10-02 ENCOUNTER — Other Ambulatory Visit: Payer: Self-pay | Admitting: Oncology

## 2018-10-02 DIAGNOSIS — R911 Solitary pulmonary nodule: Secondary | ICD-10-CM

## 2018-10-02 NOTE — Progress Notes (Unsigned)
  Pulmonary Nodule Clinic Telephone Note  Received referral from PCP, Dr. Verdell Carmine (ED).   Patient presented to the emergency room for complaints of chest pain.  Subsequently had CT angios revealing emphysema with a 7 mm right lower lobe pulmonary nodule.  It was recommended for him to have a noncontrasted chest CT 6 to 12 months from previous.  No additional imaging was available for review.  He is a current everyday smoker.  He currently does not have a PCP.  High risk factors include: History of heavy smoking, exposure to asbestos, radium or uranium, personal family history of lung cancer, older age, sex (females greater than males), race (black and native [place] greater than weight), marginal speculation, upper lobe location, multiplicity (less than 5 nodules increases risk for malignancy) and emphysema and/or pulmonary fibrosis.   This recommendation follows the consensus statement: Guidelines for Management of Incidental Pulmonary Nodules Detected on CT Images: From the Fleischner Society 2017; Radiology 2017; 284:228-243.    I have placed order for CT scan without contrast to be completed approximately *** from previous CT scan.  I would like *** to see me in our Pulmonary Nodule Clinic after *** CT scan on scheduled clinic day (Friday Morning).   Scheduling has been notified of appointment request and will call patient with appointment   Faythe Casa, NP 03/19/2018 2:22 PM

## 2018-10-03 ENCOUNTER — Telehealth: Payer: Self-pay | Admitting: *Deleted

## 2018-10-03 NOTE — Telephone Encounter (Signed)
Referral received for pt to be seen in Lung Nodule Clinic for further workup of incidental lung nodule with follow up CT scan. Left message with patient to call back to discuss clinic and review recommendations and upcoming appts including follow up CT scan and visit with Jenny Burns, NP to discuss results. Awaiting call back.  

## 2018-10-03 NOTE — Telephone Encounter (Addendum)
Pt made aware of referral to Lung Nodule Clinic. Pt is in agreement to have upcoming CT scan and follow up as recommended. Pt has been made aware of upcoming appts for follow up CT scan and follow up appt with Jennifer Burns, NP in the [facility] Clinic. Pt verbalized understanding. Nothing further needed at this time. 

## 2018-12-09 ENCOUNTER — Ambulatory Visit: Payer: PRIVATE HEALTH INSURANCE

## 2018-12-10 ENCOUNTER — Ambulatory Visit: Payer: PRIVATE HEALTH INSURANCE | Admitting: Oncology

## 2018-12-18 ENCOUNTER — Other Ambulatory Visit: Payer: Self-pay

## 2018-12-18 ENCOUNTER — Ambulatory Visit
Admission: RE | Admit: 2018-12-18 | Discharge: 2018-12-18 | Disposition: A | Discharge status: Home / Self Care | Payer: PRIVATE HEALTH INSURANCE | Source: Ambulatory Visit | Attending: Oncology | Admitting: Oncology

## 2018-12-18 DIAGNOSIS — R911 Solitary pulmonary nodule: Secondary | ICD-10-CM

## 2018-12-19 ENCOUNTER — Inpatient Hospital Stay: Payer: PRIVATE HEALTH INSURANCE | Attending: Oncology | Admitting: Oncology

## 2018-12-19 DIAGNOSIS — Z87891 Personal history of nicotine dependence: Secondary | ICD-10-CM | POA: Diagnosis not present

## 2018-12-19 DIAGNOSIS — R911 Solitary pulmonary nodule: Secondary | ICD-10-CM | POA: Diagnosis not present

## 2018-12-19 NOTE — Progress Notes (Signed)
Pulmonary Nodule Clinic Consult note [facility]  Telephone:([phone] [phone]  Patient Care Team: Patient, No Pcp Per as PCP - General (General Practice)   Name of the patient: Darrell [name]  [id number]  [birth year]   Date of visit: 12/19/2018   Diagnosis- Lung Nodule  Virtual Visit via Telephone Note   I connected with Darrell [name] on 12/19/18 at 10 am  by telephone visit and verified that I am speaking with the correct person using two identifiers.   I discussed the limitations, risks, security and privacy concerns of performing an evaluation and management service by telemedicine and the availability of in-person appointments. I also discussed with the patient that there may be a patient responsible charge related to this service. The patient expressed understanding and agreed to proceed.   Other persons participating in the visit and their role in the encounter:   Patient's location: Home  Provider's location: Office  Chief complaint/ Reason for visit- Pulmonary Nodule Clinic Initial Visit  Past Medical History:  Patient is managed/referred by emergency department.  He was evaluated for chest pain with imaging revealing emphysema with a 7 mm right lower lobe nodule.  It was recommended he have a noncontrasted chest CT in 6 to 12 months.  There was no additional imaging to review.  He currently does not have a PCP.  His past medical history is positive for: Past Medical History:  Diagnosis Date  . Cancer ([place])   . Hypertension    His past surgical history is positive for: Past Surgical History:  Procedure Laterality Date  . LEFT HEART CATH AND CORONARY ANGIOGRAPHY N/A 09/08/2018   Procedure: LEFT HEART CATH AND CORONARY ANGIOGRAPHY;  Surgeon: Corey Skains, MD;  Location: [place] [facility];  Service: Cardiovascular;  Laterality: N/A;  . ROTATOR CUFF REPAIR     Interval history-today he presents to our lung nodule clinic to  review recent CT scan.  He is a current everyday smoker. He has a personal history of cancer (unknown type). Has family history of cancer. His mother, father, grandfather and aunt all passed away from cancer. Unclear type.   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 He currently smokes 2-3 cigars daily. Unclear if he has been exposed to toxins that could cause cancer. States "we are around all kinds of chemicals" everyday.   He denies any current issues at this time.  He has an occasional cough but states "it is because I smoke".  Denies any recent fevers or illness.  He reports a great appetite and denies any weight loss.  He denies any chest pain.  He lives at home alone.   Has history of atypical chest pain and has been evaluated by cardiology.  Recently had a cardiac cath revealing minimal arthrosclerosis and no current evidence of acute coronary syndrome.  High intensity cholesterol therapy is recommended due to coronary arthrosclerosis.  He also has hypertension stable on losartan/HCTZ with the addition of metoprolol.  ECOG FS:0 - Asymptomatic  Review of systems- Review of Systems  Constitutional: Negative.  Negative for chills, fever, malaise/fatigue and weight loss.  HENT: Negative for congestion, ear pain and tinnitus.   Eyes: Negative.  Negative for blurred vision and double vision.  Respiratory: Positive for cough. Negative for sputum production and shortness of breath.   Cardiovascular: Negative.  Negative for chest pain, palpitations and leg swelling.  Gastrointestinal: Negative.  Negative for abdominal pain,  constipation, diarrhea, nausea and vomiting.  Genitourinary: Negative for dysuria, frequency and urgency.  Musculoskeletal: Negative for back pain and falls.  Skin: Negative.  Negative for rash.  Neurological: Negative.  Negative for weakness and headaches.  Endo/Heme/Allergies: Negative.  Does not bruise/bleed easily.   Psychiatric/Behavioral: Negative.  Negative for depression. The patient is not nervous/anxious and does not have insomnia.      Allergies  Allergen Reactions  . Penicillins Hives    Did it involve swelling of the face/tongue/throat, SOB, or low BP? No Did it involve sudden or severe rash/hives, skin peeling, or any reaction on the inside of your mouth or nose? No Did you need to seek medical attention at a hospital or doctor's office? No When did it last happen? If all above answers are "NO", may proceed with cephalosporin use.      Past Medical History:  Diagnosis Date  . Cancer ([place])   . Hypertension      Past Surgical History:  Procedure Laterality Date  . LEFT HEART CATH AND CORONARY ANGIOGRAPHY N/A 09/08/2018   Procedure: LEFT HEART CATH AND CORONARY ANGIOGRAPHY;  Surgeon: Corey Skains, MD;  Location: [place] [facility];  Service: Cardiovascular;  Laterality: N/A;  . ROTATOR CUFF REPAIR      Social History   Socioeconomic History  . Marital status: Legally Separated    Spouse name: Not on file  . Number of children: Not on file  . Years of education: Not on file  . Highest education level: Not on file  Occupational History  . Not on file  Social Needs  . Financial resource strain: Not on file  . Food insecurity    Worry: Not on file    Inability: Not on file  . Transportation needs    Medical: Not on file    Non-medical: Not on file  Tobacco Use  . Smoking status: Current Every Day Smoker    Types: Cigars  . Smokeless tobacco: Never Used  . Tobacco comment: 2 per day  Substance and Sexual Activity  . Alcohol use: Yes    Alcohol/week: 3.0 standard drinks    Types: 3 Cans of beer per week    Comment: daily  . Drug use: Not on file  . Sexual activity: Not on file  Lifestyle  . Physical activity    Days per week: Not on file    Minutes per session: Not on file  . Stress: Not on file  Relationships  . Social Herbalist on phone:  Not on file    Gets together: Not on file    Attends religious service: Not on file    Active member of club or organization: Not on file    Attends meetings of clubs or organizations: Not on file    Relationship status: Not on file  . Intimate partner violence    Fear of current or ex partner: Not on file    Emotionally abused: Not on file    Physically abused: Not on file    Forced sexual activity: Not on file  Other Topics Concern  . Not on file  Social History Narrative  . Not on file    No family history on file.   Current Outpatient Medications:  .  aspirin EC 81 MG EC tablet, Take 1 tablet (81 mg total) by mouth daily., Disp: 120 tablet, Rfl: 0 .  atorvastatin (LIPITOR) 40 MG tablet, Take 1 tablet (40 mg total) by mouth  daily at 6 PM., Disp: 30 tablet, Rfl: 0 .  losartan-hydrochlorothiazide (HYZAAR) 50-12.5 MG tablet, Take 1 tablet by mouth daily., Disp: 30 tablet, Rfl: 1 .  metoprolol tartrate (LOPRESSOR) 25 MG tablet, Take 1 tablet (25 mg total) by mouth 2 (two) times daily., Disp: 60 tablet, Rfl: 0 .  nicotine (NICODERM CQ) 21 mg/24hr patch, Place 1 patch (21 mg total) onto the skin daily., Disp: 28 patch, Rfl: 0  Physical exam: There were no vitals filed for this visit. Limited d/t telephone visit  CMP Latest Ref Rng & Units 09/07/2018  Glucose 70 - 99 mg/dL 104(H)  BUN 6 - 20 mg/dL 26(H)  Creatinine 0.61 - 1.24 mg/dL 1.11  Sodium 135 - 145 mmol/L 140  Potassium 3.5 - 5.1 mmol/L 3.5  Chloride 98 - 111 mmol/L 109  CO2 22 - 32 mmol/L 22  Calcium 8.9 - 10.3 mg/dL 8.6(L)  Total Protein 6.5 - 8.1 g/dL -  Total Bilirubin 0.3 - 1.2 mg/dL -  Alkaline Phos 38 - 126 U/L -  AST 15 - 41 U/L -  ALT 0 - 44 U/L -   CBC Latest Ref Rng & Units 09/08/2018  WBC 4.0 - 10.5 K/uL 7.5  Hemoglobin 13.0 - 17.0 g/dL 11.3(L)  Hematocrit 39.0 - 52.0 % 32.4(L)  Platelets 150 - 400 K/uL 291    No images are attached to the encounter.  Ct Chest Wo Contrast  Result Date: 12/18/2018  CLINICAL DATA:  58 year old male with follow-up of pulmonary nodule. EXAM: CT CHEST WITHOUT CONTRAST TECHNIQUE: Multidetector CT imaging of the chest was performed following the standard protocol without IV contrast. COMPARISON:  Chest CT dated 06/07/2018. FINDINGS: Evaluation of this exam is limited in the absence of intravenous contrast. Cardiovascular: There is no cardiomegaly or pericardial effusion. The thoracic aorta and central pulmonary arteries appear unremarkable on this noncontrast CT. Mediastinum/Nodes: No hilar or mediastinal adenopathy. The esophagus and the thyroid gland are grossly unremarkable. No mediastinal fluid collection. Lungs/Pleura: Moderate to advanced centrilobular emphysema with large biapical subpleural blebs measuring up to 5.6 cm in the left apex. Stable appearance of the previously seen right middle lobe subpleural nodule. Continued follow-up as per recommendation of the prior CT. There is no consolidative changes. No pleural effusion or pneumothorax. The central airways are patent. Upper Abdomen: Focal area of irregularity of the posterolateral left renal contour similar to prior CT. Small scattered colonic diverticula. Linear calcification along the superior aspect of the right kidney, likely sequela of prior inflammatory process. The visualized upper abdomen is otherwise unremarkable. Musculoskeletal: No chest wall mass or suspicious bone lesions identified. IMPRESSION: 1. No acute intrathoracic pathology. 2. Stable appearance of the previously seen right middle lobe subpleural nodule. Continued follow-up as per recommendation of the prior CT. 3. Emphysema and aortic atherosclerosis. Aortic Atherosclerosis (ICD10-I70.0) and Emphysema (ICD10-J43.9). Electronically Signed   By: Anner Crete M.D.   On: 12/18/2018 09:57     Assessment and plan- Patient is a 58 y.o. male who presents to pulmonary nodule clinic for follow-up of incidental lung nodules.  A telephone visit was  conducted to review most recent CT scan results.    CT chest without contrast from 12/18/18 revealed stable appearance of the previously seen right middle lobe subpleural nodule. Continued follow-up as per recommendation of the prior CT. 5.6 cm in size.  Slight decrease in size from previous.  06/07/18 7 mm in size 11/17/18 5.6 mm   Calculating malignancy probability of a pulmonary nodule: Risk factors include:  1.  Age. 2.  Cancer history. 3.  Diameter of pulmonary nodule and mm 4.  Location 5.  Smoking history 6.  Spiculation present   Based on risk factors, this patient is high risk for the development of lung cancer.  I would recommend a 61-month follow-up with imaging to ensure stability and given smoking history.  If imaging stable in 70-months, would recommend follow-up with annual low-dose CT screening. Will make referral if appropriate.    During our visit, we discussed pulmonary nodules are a common incidental finding and are often how lung cancer is discovered.  Lung cancer survival is directly related to the stage at diagnosis.  We discussed that nodules can vary in presentation from solitary pulmonary nodules to masses, 2 groundglass opacities and multiple nodules.  Pulmonary nodules in the majority of cases are benign but the probability of these becoming malignant cannot be undermined.  Early identification of malignant nodules could lead to early diagnosis and increased survival.   We discussed the probability of pulmonary nodules becoming malignant increase with age, pack years of tobacco use, size/characteristics of the nodule and location; with upper lobe involvement being most worrisome.   We discussed the goal of our clinic is to thoroughly evaluate each nodule, developed a comprehensive, individualized plan of care utilizing the most advanced technology and significantly reduce the time from detection to treatment.  A dedicated pulmonary nodule clinic has proven to indeed  expedite the detection and treatment of lung cancer.   Patient education in fact sheet provided along with most recent CT scans.  Disposition: RTC in 12 months for repeat CT scan and results.   Visit Diagnosis 1. Personal history of tobacco use, presenting hazards to health   2. Lung nodule     Patient expressed understanding and was in agreement with this plan. He also understands that He can call clinic at any time with any questions, concerns, or complaints.   Greater than 50% was spent in counseling and coordination of care with this patient including but not limited to discussion of the relevant topics above (See A&P) including, but not limited to diagnosis and management of acute and chronic medical conditions.   Thank you for allowing me to participate in the care of this very pleasant patient.    Jacquelin Hawking, NP [place] [facility] Cell - [phone] Pager- [phone] 12/19/2018 1:49 PM

## 2019-02-13 ENCOUNTER — Encounter: Payer: Self-pay | Admitting: Emergency Medicine

## 2019-02-13 ENCOUNTER — Other Ambulatory Visit: Payer: Self-pay

## 2019-02-13 ENCOUNTER — Emergency Department: Payer: PRIVATE HEALTH INSURANCE

## 2019-02-13 ENCOUNTER — Emergency Department
Admission: EM | Admit: 2019-02-13 | Discharge: 2019-02-13 | Disposition: A | Discharge status: Home / Self Care | Payer: PRIVATE HEALTH INSURANCE | Attending: Emergency Medicine | Admitting: Emergency Medicine

## 2019-02-13 DIAGNOSIS — Z859 Personal history of malignant neoplasm, unspecified: Secondary | ICD-10-CM | POA: Insufficient documentation

## 2019-02-13 DIAGNOSIS — K219 Gastro-esophageal reflux disease without esophagitis: Secondary | ICD-10-CM | POA: Insufficient documentation

## 2019-02-13 DIAGNOSIS — Z79899 Other long term (current) drug therapy: Secondary | ICD-10-CM | POA: Insufficient documentation

## 2019-02-13 DIAGNOSIS — R1013 Epigastric pain: Secondary | ICD-10-CM | POA: Diagnosis not present

## 2019-02-13 DIAGNOSIS — R42 Dizziness and giddiness: Secondary | ICD-10-CM | POA: Diagnosis not present

## 2019-02-13 DIAGNOSIS — I10 Essential (primary) hypertension: Secondary | ICD-10-CM | POA: Insufficient documentation

## 2019-02-13 DIAGNOSIS — R531 Weakness: Secondary | ICD-10-CM | POA: Diagnosis present

## 2019-02-13 DIAGNOSIS — Z7982 Long term (current) use of aspirin: Secondary | ICD-10-CM | POA: Insufficient documentation

## 2019-02-13 LAB — BASIC METABOLIC PANEL
Anion gap: 14 (ref 5–15)
BUN: 26 mg/dL — ABNORMAL HIGH (ref 6–20)
CO2: 20 mmol/L — ABNORMAL LOW (ref 22–32)
Calcium: 9.5 mg/dL (ref 8.9–10.3)
Chloride: 105 mmol/L (ref 98–111)
Creatinine, Ser: 1.14 mg/dL (ref 0.61–1.24)
GFR calc Af Amer: >60 mL/min (ref >60)
GFR calc non Af Amer: >60 mL/min (ref >60)
Glucose, Bld: 88 mg/dL (ref 70–99)
Potassium: 3.8 mmol/L (ref 3.5–5.1)
Sodium: 139 mmol/L (ref 135–145)

## 2019-02-13 LAB — CBC
HCT: 44 % (ref 39.0–52.0)
Hemoglobin: 15.1 g/dL (ref 13.0–17.0)
MCH: 31.3 pg (ref 26.0–34.0)
MCHC: 34.3 g/dL (ref 30.0–36.0)
MCV: 91.3 fL (ref 80.0–100.0)
Platelets: 337 10*3/uL (ref 150–400)
RBC: 4.82 MIL/uL (ref 4.22–5.81)
RDW: 14.1 % (ref 11.5–15.5)
WBC: 10.2 10*3/uL (ref 4.0–10.5)
nRBC: 0 % (ref 0.0–0.2)

## 2019-02-13 LAB — TROPONIN I (HIGH SENSITIVITY)
Troponin I (High Sensitivity): 56 ng/L — ABNORMAL HIGH (ref <18)
Troponin I (High Sensitivity): 44 ng/L — ABNORMAL HIGH (ref <18)

## 2019-02-13 MED ORDER — SODIUM CHLORIDE 0.9% FLUSH: 3.0000 mL | Freq: Once | INTRAVENOUS | Status: DC

## 2019-02-13 MED ORDER — OMEPRAZOLE 20 MG PO CPDR: 20.0000 mg | DELAYED_RELEASE_CAPSULE | Freq: Every day | ORAL | 1 refills | Status: DC

## 2019-02-13 NOTE — ED Notes (Signed)
Pt discharged home after verbalizing understanding of discharge instructions; nad noted. 

## 2019-02-13 NOTE — ED Provider Notes (Signed)
[facility] Emergency Department Provider Note   ____________________________________________   First MD Initiated Contact with Patient 02/13/19 1127     (approximate)  I have reviewed the triage vital signs and the nursing notes.   HISTORY  Chief Complaint Weakness    HPI Darrell [name] is a 59 y.o. male with past medical history of hypertension who presents to the ED complaining of weakness.  Patient reports that he was feeling fine when he got to work this morning but then had sudden onset of lightheadedness and generalized weakness.  He reports feeling especially lightheaded when he went to lean over while at work.  He briefly felt like he was going to pass out and EMS was called to take him to the ED.  He now states that he feels much better and he did not have any chest pain or shortness of breath with the episode today.  He does state that he will have occasional "episodes" where he has discomfort in his epigastrium along with similar lightheadedness.  He had 1 of these earlier in the week that resolved on its own and he had a similar episode last August, where he was admitted for further cardiac work-up.  Cardiac catheterization at that time showed stenosis of no greater than 40% and symptoms were thought to be noncardiac in origin.  He does state that he will feel like this when he is not taking medicine for reflux and he has been out of his reflux medication for the past couple of days.        Past Medical History:  Diagnosis Date  . Cancer ([place])   . Hypertension     Patient Active Problem List   Diagnosis Date Noted  . Chest pain 06/07/2018    Past Surgical History:  Procedure Laterality Date  . LEFT HEART CATH AND CORONARY ANGIOGRAPHY N/A 09/08/2018   Procedure: LEFT HEART CATH AND CORONARY ANGIOGRAPHY;  Surgeon: Corey Skains, MD;  Location: [place] [facility];  Service: Cardiovascular;  Laterality: N/A;  . ROTATOR CUFF REPAIR       Prior to Admission medications   Medication Sig Start Date End Date Taking? Authorizing Provider  aspirin EC 81 MG EC tablet Take 1 tablet (81 mg total) by mouth daily. 09/09/18   Bettey Costa, MD  atorvastatin (LIPITOR) 40 MG tablet Take 1 tablet (40 mg total) by mouth daily at 6 PM. 09/08/18   Bettey Costa, MD  losartan-hydrochlorothiazide (HYZAAR) 50-12.5 MG tablet Take 1 tablet by mouth daily. 06/09/18 09/07/18  Henreitta Leber, MD  metoprolol tartrate (LOPRESSOR) 25 MG tablet Take 1 tablet (25 mg total) by mouth 2 (two) times daily. 09/08/18   Bettey Costa, MD  nicotine (NICODERM CQ) 21 mg/24hr patch Place 1 patch (21 mg total) onto the skin daily. 09/08/18   Bettey Costa, MD  omeprazole (PRILOSEC) 20 MG capsule Take 1 capsule (20 mg total) by mouth daily. 02/13/19 02/13/20  Blake Divine, MD    Allergies Penicillins  History reviewed. No pertinent family history.  Social History Social History   Tobacco Use  . Smoking status: Current Every Day Smoker    Types: Cigars  . Smokeless tobacco: Never Used  . Tobacco comment: 2 per day  Substance Use Topics  . Alcohol use: Yes    Alcohol/week: 3.0 standard drinks    Types: 3 Cans of beer per week    Comment: daily  . Drug use: Not on file    Review of  Systems  Constitutional: No fever/chills.  Positive for lightheadedness and dizziness. Eyes: No visual changes. ENT: No sore throat. Cardiovascular: Denies chest pain. Respiratory: Denies shortness of breath. Gastrointestinal: No abdominal pain.  No nausea, no vomiting.  No diarrhea.  No constipation. Genitourinary: Negative for dysuria. Musculoskeletal: Negative for back pain. Skin: Negative for rash. Neurological: Negative for headaches, focal weakness or numbness.  ____________________________________________   PHYSICAL EXAM:  VITAL SIGNS: ED Triage Vitals  Enc Vitals Group     BP 02/13/19 0846 123/77     Pulse Rate 02/13/19 0846 82     Resp 02/13/19 0846 18     Temp  02/13/19 0846 97.9 F (36.6 C)     Temp Source 02/13/19 0846 Oral     SpO2 02/13/19 0846 100 %     Weight 02/13/19 0858 145 lb (65.8 kg)     Height 02/13/19 0858 5\' 8"  (1.727 m)     Head Circumference --      Peak Flow --      Pain Score 02/13/19 0858 2     Pain Loc --      Pain Edu? --      Excl. in [place]? --     Constitutional: Alert and oriented. Eyes: Conjunctivae are normal. Head: Atraumatic. Nose: No congestion/rhinnorhea. Mouth/Throat: Mucous membranes are moist. Neck: Normal ROM Cardiovascular: Normal rate, regular rhythm. Grossly normal heart sounds. Respiratory: Normal respiratory effort.  No retractions. Lungs CTAB. Gastrointestinal: Soft and nontender. No distention. Genitourinary: deferred Musculoskeletal: No lower extremity tenderness nor edema. Neurologic:  Normal speech and language. No gross focal neurologic deficits are appreciated. Skin:  Skin is warm, dry and intact. No rash noted. Psychiatric: Mood and affect are normal. Speech and behavior are normal.  ____________________________________________   LABS (all labs ordered are listed, but only abnormal results are displayed)  Labs Reviewed  BASIC METABOLIC PANEL - Abnormal; Notable for the following components:      Result Value   CO2 20 (*)    BUN 26 (*)    All other components within normal limits  TROPONIN I (HIGH SENSITIVITY) - Abnormal; Notable for the following components:   Troponin I (High Sensitivity) 56 (*)    All other components within normal limits  TROPONIN I (HIGH SENSITIVITY) - Abnormal; Notable for the following components:   Troponin I (High Sensitivity) 44 (*)    All other components within normal limits  CBC   ____________________________________________  EKG  ED ECG REPORT I, Blake Divine, the attending physician, personally viewed and interpreted this ECG.   Date: 02/13/2019  EKG Time: 8:53  Rate: 83  Rhythm: normal sinus rhythm  Axis: Normal  Intervals:none  ST&T  Change: None   PROCEDURES  Procedure(s) performed (including Critical Care):  Procedures   ____________________________________________   INITIAL IMPRESSION / ASSESSMENT AND PLAN / ED COURSE       59 year old male with history of hypertension and GERD presents to the ED following episode of lightheadedness and dizziness earlier today, has had similar recently with epigastric discomfort.  He did have a similar episode in August of last year, at which point catheterization showed mild diffuse disease and was thought to not be the etiology of his symptoms.  His EKG today shows no evidence of arrhythmia or ischemia and he is essentially asymptomatic at the time of my evaluation.  He does have a mildly elevated troponin, although not as elevated as it was during prior admission.  Will check second set and touch base  with cardiology, but anticipate discharge home if second set is unremarkable.  Second set of troponin is downtrending and case was discussed with Dr. Rockey Situ of cardiology, who agrees with plan for discharge home with close cardiology follow-up.  I have counseled patient to follow-up with cardiology, will restart his PPI.      ____________________________________________   FINAL CLINICAL IMPRESSION(S) / ED DIAGNOSES  Final diagnoses:  Lightheadedness  Epigastric pain  Gastroesophageal reflux disease, unspecified whether esophagitis present     ED Discharge Orders         Ordered    omeprazole (PRILOSEC) 20 MG capsule  Daily     02/13/19 1332           Note:  This document was prepared using Dragon voice recognition software and may include unintentional dictation errors.   Blake Divine, MD 02/13/19 [phone]

## 2019-02-13 NOTE — ED Triage Notes (Addendum)
First RN Note: pt presents to ED via [facility] with c/o generalized weakness and dizziness, pt states hx of acid reflux, states when he runs of meds he feels like this. EMS reports that patient was going to get it filled today, reports that patient had sudden onset CP that lasted approx 30 minutes but has since resolved PTA, reports during episode had SOB with O2 sats 89 during SOB, since then O2 sats have returned to normal and patient no longer with labored breathing.   Pt states he was at work when he just all of a sudden became drained and had generalized weakness. Pt A&O x 4.

## 2019-03-03 ENCOUNTER — Telehealth: Payer: Self-pay | Admitting: *Deleted

## 2019-03-03 NOTE — Telephone Encounter (Signed)
Pt informed of upcoming appts in Nov for follow up CT scan and follow up with Sonia Baller, NP in the lung nodule clinic. Pt requested appts be mailed. Instructed to call back if has any future questions or needs. Pt verbalized understanding.

## 2019-08-27 ENCOUNTER — Emergency Department: Payer: PRIVATE HEALTH INSURANCE

## 2019-08-27 ENCOUNTER — Other Ambulatory Visit: Payer: Self-pay

## 2019-08-27 ENCOUNTER — Emergency Department
Admission: EM | Admit: 2019-08-27 | Discharge: 2019-08-28 | Disposition: A | Discharge status: Home / Self Care | Payer: PRIVATE HEALTH INSURANCE | Attending: Emergency Medicine | Admitting: Emergency Medicine

## 2019-08-27 ENCOUNTER — Encounter: Payer: Self-pay | Admitting: *Deleted

## 2019-08-27 DIAGNOSIS — S060X9A Concussion with loss of consciousness of unspecified duration, initial encounter: Secondary | ICD-10-CM | POA: Diagnosis present

## 2019-08-27 DIAGNOSIS — Y999 Unspecified external cause status: Secondary | ICD-10-CM | POA: Insufficient documentation

## 2019-08-27 DIAGNOSIS — Z7982 Long term (current) use of aspirin: Secondary | ICD-10-CM | POA: Insufficient documentation

## 2019-08-27 DIAGNOSIS — F10929 Alcohol use, unspecified with intoxication, unspecified: Secondary | ICD-10-CM

## 2019-08-27 DIAGNOSIS — S0219XA Other fracture of base of skull, initial encounter for closed fracture: Secondary | ICD-10-CM

## 2019-08-27 DIAGNOSIS — I10 Essential (primary) hypertension: Secondary | ICD-10-CM | POA: Diagnosis not present

## 2019-08-27 DIAGNOSIS — F10129 Alcohol abuse with intoxication, unspecified: Secondary | ICD-10-CM | POA: Insufficient documentation

## 2019-08-27 DIAGNOSIS — Y939 Activity, unspecified: Secondary | ICD-10-CM | POA: Diagnosis not present

## 2019-08-27 DIAGNOSIS — S0281XA Fracture of other specified skull and facial bones, right side, initial encounter for closed fracture: Secondary | ICD-10-CM | POA: Diagnosis not present

## 2019-08-27 DIAGNOSIS — Z79899 Other long term (current) drug therapy: Secondary | ICD-10-CM | POA: Insufficient documentation

## 2019-08-27 DIAGNOSIS — F17290 Nicotine dependence, other tobacco product, uncomplicated: Secondary | ICD-10-CM | POA: Diagnosis not present

## 2019-08-27 DIAGNOSIS — Y929 Unspecified place or not applicable: Secondary | ICD-10-CM | POA: Diagnosis not present

## 2019-08-27 LAB — CBC
HCT: 34.5 % — ABNORMAL LOW (ref 39.0–52.0)
Hemoglobin: 11.9 g/dL — ABNORMAL LOW (ref 13.0–17.0)
MCH: 33.1 pg (ref 26.0–34.0)
MCHC: 34.5 g/dL (ref 30.0–36.0)
MCV: 95.8 fL (ref 80.0–100.0)
Platelets: 311 10*3/uL (ref 150–400)
RBC: 3.6 MIL/uL — ABNORMAL LOW (ref 4.22–5.81)
RDW: 14.1 % (ref 11.5–15.5)
WBC: 9.2 10*3/uL (ref 4.0–10.5)
nRBC: 0 % (ref 0.0–0.2)
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
## 2019-08-28 ENCOUNTER — Emergency Department: Payer: PRIVATE HEALTH INSURANCE

## 2019-08-28 LAB — COMPREHENSIVE METABOLIC PANEL
ALT: 15 U/L (ref 0–44)
AST: 24 U/L (ref 15–41)
Albumin: 4 g/dL (ref 3.5–5.0)
Alkaline Phosphatase: 67 U/L (ref 38–126)
Anion gap: 12 (ref 5–15)
BUN: 22 mg/dL — ABNORMAL HIGH (ref 6–20)
CO2: 21 mmol/L — ABNORMAL LOW (ref 22–32)
Calcium: 9.1 mg/dL (ref 8.9–10.3)
Chloride: 106 mmol/L (ref 98–111)
Creatinine, Ser: 1.12 mg/dL (ref 0.61–1.24)
GFR calc Af Amer: >60 mL/min (ref >60)
GFR calc non Af Amer: >60 mL/min (ref >60)
Glucose, Bld: 93 mg/dL (ref 70–99)
Potassium: 3.4 mmol/L — ABNORMAL LOW (ref 3.5–5.1)
Sodium: 139 mmol/L (ref 135–145)
Total Bilirubin: 1 mg/dL (ref 0.3–1.2)
Total Protein: 7.4 g/dL (ref 6.5–8.1)

## 2019-08-28 LAB — ETHANOL: Alcohol, Ethyl (B): 213 mg/dL — ABNORMAL HIGH (ref <10)

## 2019-08-28 MED ORDER — MORPHINE SULFATE (PF) 2 MG/ML IV SOLN
2.0000 mg | Freq: Once | INTRAVENOUS | Status: AC
  Administered 2019-08-28: 2 mg via INTRAVENOUS
  Filled 2019-08-28: qty 1

## 2019-08-28 MED ORDER — ONDANSETRON HCL 4 MG/2ML IJ SOLN
4.0000 mg | Freq: Once | INTRAMUSCULAR | Status: AC
  Administered 2019-08-28: 4 mg via INTRAVENOUS
  Filled 2019-08-28: qty 2

## 2019-08-28 MED ORDER — OXYCODONE-ACETAMINOPHEN 5-325 MG PO TABS: 1.0000 | ORAL_TABLET | Freq: Two times a day (BID) | ORAL | 0 refills | Status: AC | PRN

## 2019-08-28 NOTE — ED Notes (Signed)
Pt went to toilet w/o obtaining urine and ambulated to toilet w/o asst.

## 2019-08-28 NOTE — ED Notes (Signed)
Vaughan Basta petite : [phone] Girl friend.

## 2019-08-28 NOTE — ED Provider Notes (Signed)
IMPRESSION: 1. No acute facial bone fractures are identified. 2. Remote appearing fracture of the left zygoma. 3. Left temporal bone fracture as noted on prior temporal bone study. 4. Scattered sinus disease. CT as dictated above.  He is cleared for outpatient follow-up.   Earleen Newport, MD 08/28/19 1010

## 2019-09-02 ENCOUNTER — Encounter: Payer: Self-pay | Admitting: Emergency Medicine

## 2019-09-02 ENCOUNTER — Emergency Department: Payer: PRIVATE HEALTH INSURANCE

## 2019-09-02 ENCOUNTER — Emergency Department
Admission: EM | Admit: 2019-09-02 | Discharge: 2019-09-02 | Disposition: A | Discharge status: Home / Self Care | Payer: PRIVATE HEALTH INSURANCE | Attending: Emergency Medicine | Admitting: Emergency Medicine

## 2019-09-02 ENCOUNTER — Other Ambulatory Visit: Payer: Self-pay

## 2019-09-02 DIAGNOSIS — Y939 Activity, unspecified: Secondary | ICD-10-CM | POA: Insufficient documentation

## 2019-09-02 DIAGNOSIS — F17290 Nicotine dependence, other tobacco product, uncomplicated: Secondary | ICD-10-CM | POA: Insufficient documentation

## 2019-09-02 DIAGNOSIS — I10 Essential (primary) hypertension: Secondary | ICD-10-CM | POA: Diagnosis not present

## 2019-09-02 DIAGNOSIS — Y999 Unspecified external cause status: Secondary | ICD-10-CM | POA: Insufficient documentation

## 2019-09-02 DIAGNOSIS — R531 Weakness: Secondary | ICD-10-CM | POA: Diagnosis not present

## 2019-09-02 DIAGNOSIS — R519 Headache, unspecified: Secondary | ICD-10-CM | POA: Diagnosis not present

## 2019-09-02 DIAGNOSIS — W19XXXA Unspecified fall, initial encounter: Secondary | ICD-10-CM | POA: Insufficient documentation

## 2019-09-02 DIAGNOSIS — S0282XA Fracture of other specified skull and facial bones, left side, initial encounter for closed fracture: Secondary | ICD-10-CM | POA: Insufficient documentation

## 2019-09-02 DIAGNOSIS — Z7982 Long term (current) use of aspirin: Secondary | ICD-10-CM | POA: Diagnosis not present

## 2019-09-02 DIAGNOSIS — H532 Diplopia: Secondary | ICD-10-CM

## 2019-09-02 DIAGNOSIS — Z859 Personal history of malignant neoplasm, unspecified: Secondary | ICD-10-CM | POA: Diagnosis not present

## 2019-09-02 DIAGNOSIS — Y929 Unspecified place or not applicable: Secondary | ICD-10-CM | POA: Insufficient documentation

## 2019-09-02 DIAGNOSIS — R269 Unspecified abnormalities of gait and mobility: Secondary | ICD-10-CM | POA: Diagnosis not present

## 2019-09-02 DIAGNOSIS — Z79899 Other long term (current) drug therapy: Secondary | ICD-10-CM | POA: Insufficient documentation

## 2019-09-02 DIAGNOSIS — S0990XA Unspecified injury of head, initial encounter: Secondary | ICD-10-CM | POA: Diagnosis present

## 2019-09-02 DIAGNOSIS — R42 Dizziness and giddiness: Secondary | ICD-10-CM | POA: Diagnosis not present

## 2019-09-02 DIAGNOSIS — J439 Emphysema, unspecified: Secondary | ICD-10-CM | POA: Diagnosis not present

## 2019-09-02 LAB — COMPREHENSIVE METABOLIC PANEL
ALT: 21 U/L (ref 0–44)
AST: 21 U/L (ref 15–41)
Albumin: 4.2 g/dL (ref 3.5–5.0)
Alkaline Phosphatase: 81 U/L (ref 38–126)
Anion gap: 12 (ref 5–15)
BUN: 14 mg/dL (ref 6–20)
CO2: 27 mmol/L (ref 22–32)
Calcium: 9.5 mg/dL (ref 8.9–10.3)
Chloride: 102 mmol/L (ref 98–111)
Creatinine, Ser: 0.97 mg/dL (ref 0.61–1.24)
GFR calc Af Amer: >60 mL/min (ref >60)
GFR calc non Af Amer: >60 mL/min (ref >60)
Glucose, Bld: 108 mg/dL — ABNORMAL HIGH (ref 70–99)
Potassium: 3.5 mmol/L (ref 3.5–5.1)
Sodium: 141 mmol/L (ref 135–145)
Total Bilirubin: 0.9 mg/dL (ref 0.3–1.2)
Total Protein: 8 g/dL (ref 6.5–8.1)

## 2019-09-02 LAB — CBC
HCT: 39.6 % (ref 39.0–52.0)
Hemoglobin: 13.7 g/dL (ref 13.0–17.0)
MCH: 32.9 pg (ref 26.0–34.0)
MCHC: 34.6 g/dL (ref 30.0–36.0)
MCV: 95 fL (ref 80.0–100.0)
Platelets: 348 10*3/uL (ref 150–400)
RBC: 4.17 MIL/uL — ABNORMAL LOW (ref 4.22–5.81)
RDW: 13.6 % (ref 11.5–15.5)
WBC: 8.3 10*3/uL (ref 4.0–10.5)
nRBC: 0 % (ref 0.0–0.2)

## 2019-09-02 LAB — PROTIME-INR
INR: 1 (ref 0.8–1.2)
Prothrombin Time: 12.7 seconds (ref 11.4–15.2)

## 2019-09-02 LAB — DIFFERENTIAL
Abs Immature Granulocytes: 0.02 10*3/uL (ref 0.00–0.07)
Basophils Absolute: 0 10*3/uL (ref 0.0–0.1)
Basophils Relative: 0 %
Eosinophils Absolute: 0.1 10*3/uL (ref 0.0–0.5)
Eosinophils Relative: 1 %
Immature Granulocytes: 0 %
Lymphocytes Relative: 34 %
Lymphs Abs: 2.8 10*3/uL (ref 0.7–4.0)
Monocytes Absolute: 0.7 10*3/uL (ref 0.1–1.0)
Monocytes Relative: 9 %
Neutro Abs: 4.7 10*3/uL (ref 1.7–7.7)
Neutrophils Relative %: 56 %

## 2019-09-02 LAB — APTT: aPTT: 29 seconds (ref 24–36)

## 2019-09-02 MED ORDER — SODIUM CHLORIDE 0.9% FLUSH: 3.0000 mL | Freq: Once | INTRAVENOUS | Status: DC

## 2019-09-02 MED ORDER — MECLIZINE HCL 25 MG PO TABS: 25.0000 mg | ORAL_TABLET | Freq: Three times a day (TID) | ORAL | 0 refills | Status: AC | PRN

## 2019-09-02 MED ORDER — PREDNISONE 20 MG PO TABS: 60.0000 mg | ORAL_TABLET | Freq: Every day | ORAL | 0 refills | Status: AC

## 2019-09-02 MED ORDER — GADOBUTROL 1 MMOL/ML IV SOLN
6.0000 mL | Freq: Once | INTRAVENOUS | Status: AC | PRN
  Administered 2019-09-02: 6 mL via INTRAVENOUS

## 2019-09-02 MED ORDER — MECLIZINE HCL 25 MG PO TABS
25.0000 mg | ORAL_TABLET | Freq: Once | ORAL | Status: AC
  Administered 2019-09-02: 25 mg via ORAL
  Filled 2019-09-02: qty 1

## 2019-09-02 NOTE — ED Notes (Signed)
Pt still in MRI 

## 2019-09-02 NOTE — ED Notes (Signed)
Pt given phone for MRI screening.

## 2019-09-02 NOTE — ED Provider Notes (Signed)
-----------------------------------------   3:03 PM on 09/02/2019 -----------------------------------------  Blood pressure (!) 160/85, pulse 80, temperature 98.3 F (36.8 C), temperature source Oral, resp. rate 17, height 5\' 8"  (1.727 m), weight 65.8 kg, SpO2 100 %.  Assuming care from Dr. Ellender Hose.  In short, Darrell [name] is a 59 y.o. male with a chief complaint of Blurred Vision .  Refer to the original H&P for additional details.  The current plan of care is to follow-up MRI results for potential stroke, if negative patient may be discharged home with meclizine and prednisone with ENT follow-up.  ----------------------------------------- 7:49 PM on 09/02/2019 -----------------------------------------  CT C-spine as well as flexion-extension films of cervical spine were recommended by neurosurgery.  These were performed and negative for acute process.  MRI redemonstrated temporal bone fracture and showed very small SDH as well as temporal lobe contusion.  These images were reviewed by Dr. Cari Caraway of neurosurgery, who states that patient is appropriate for discharge home with neurosurgery and ENT follow-up.  Patient states that his dizziness is slightly improved following dose of meclizine.  He was prescribed meclizine and course of steroids, counseled to follow-up with neurosurgery and ENT, otherwise return to the ED for new or worsening symptoms.  Patient agrees with plan.    Blake Divine, MD 09/02/19 [phone]
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
## 2019-09-02 NOTE — ED Triage Notes (Signed)
C/O blurred vision and headache x 1 day.  States noticed symptoms yesterday morning at 0600 when he woke up from sleeping.  AAOx3.  Skin warm and dry.  NAD
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
## 2019-09-02 NOTE — ED Notes (Signed)
Pt eating dinner tray °

## 2019-09-02 NOTE — ED Notes (Signed)
Resumed care from [facility].  Pt alert.  nsr on monitor.

## 2019-09-04 ENCOUNTER — Other Ambulatory Visit: Payer: Self-pay | Admitting: Nurse Practitioner

## 2019-09-04 DIAGNOSIS — S065X9A Traumatic subdural hemorrhage with loss of consciousness of unspecified duration, initial encounter: Secondary | ICD-10-CM

## 2019-09-04 DIAGNOSIS — S020XXD Fracture of vault of skull, subsequent encounter for fracture with routine healing: Secondary | ICD-10-CM

## 2019-09-04 DIAGNOSIS — S0219XD Other fracture of base of skull, subsequent encounter for fracture with routine healing: Secondary | ICD-10-CM

## 2019-09-04 DIAGNOSIS — S065XAA Traumatic subdural hemorrhage with loss of consciousness status unknown, initial encounter: Secondary | ICD-10-CM

## 2019-09-08 ENCOUNTER — Encounter: Payer: Self-pay | Admitting: Internal Medicine

## 2019-09-08 ENCOUNTER — Other Ambulatory Visit: Payer: Self-pay

## 2019-09-08 ENCOUNTER — Ambulatory Visit (INDEPENDENT_AMBULATORY_CARE_PROVIDER_SITE_OTHER): Payer: PRIVATE HEALTH INSURANCE | Admitting: Internal Medicine

## 2019-09-08 VITALS — BP 141/93 | HR 91 | Ht 68.0 in | Wt 146.7 lb

## 2019-09-08 DIAGNOSIS — F1010 Alcohol abuse, uncomplicated: Secondary | ICD-10-CM | POA: Diagnosis not present

## 2019-09-08 DIAGNOSIS — I10 Essential (primary) hypertension: Secondary | ICD-10-CM

## 2019-09-08 DIAGNOSIS — S065XAA Traumatic subdural hemorrhage with loss of consciousness status unknown, initial encounter: Secondary | ICD-10-CM

## 2019-09-08 DIAGNOSIS — S0291XA Unspecified fracture of skull, initial encounter for closed fracture: Secondary | ICD-10-CM | POA: Insufficient documentation

## 2019-09-08 DIAGNOSIS — S0291XD Unspecified fracture of skull, subsequent encounter for fracture with routine healing: Secondary | ICD-10-CM | POA: Diagnosis not present

## 2019-09-08 DIAGNOSIS — H9212 Otorrhea, left ear: Secondary | ICD-10-CM

## 2019-09-08 DIAGNOSIS — E785 Hyperlipidemia, unspecified: Secondary | ICD-10-CM

## 2019-09-08 DIAGNOSIS — Z720 Tobacco use: Secondary | ICD-10-CM | POA: Diagnosis not present

## 2019-09-08 DIAGNOSIS — H532 Diplopia: Secondary | ICD-10-CM

## 2019-09-08 DIAGNOSIS — R260 Ataxic gait: Secondary | ICD-10-CM | POA: Insufficient documentation

## 2019-09-08 DIAGNOSIS — S065X9A Traumatic subdural hemorrhage with loss of consciousness of unspecified duration, initial encounter: Secondary | ICD-10-CM | POA: Diagnosis not present

## 2019-09-08 DIAGNOSIS — K219 Gastro-esophageal reflux disease without esophagitis: Secondary | ICD-10-CM

## 2019-09-08 DIAGNOSIS — S069X9D Unspecified intracranial injury with loss of consciousness of unspecified duration, subsequent encounter: Secondary | ICD-10-CM

## 2019-09-08 NOTE — Assessment & Plan Note (Signed)
The patient was advised to stop drinking.

## 2019-09-08 NOTE — Assessment & Plan Note (Signed)
Recent brain MRI showed a trace right-sided subdural hematoma. There is no weakness of either lower extremities.
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
## 2019-09-08 NOTE — Assessment & Plan Note (Signed)
He has a fracture on the left side. He also complains of post-concussive headache. His gait is unsteady. He was initially told to not go back to work for at least four weeks. If he has any recurrent headaches, dizziness, nausea, vomiting, or progressive deterioration of gait, then he was advised to go back to the hospital for further evaluation. He should not be driving a car. Refrain from alcohol and smoking.

## 2019-09-08 NOTE — Assessment & Plan Note (Signed)
The patient's wife complained of bloody discharge from the left ear, which was soaking tissue paper after the head injury. I examined the left ear today. Apparently the bleeding has stopped and there is no wax or signs of infection.

## 2019-09-08 NOTE — Assessment & Plan Note (Signed)
-   I instructed the patient to stop smoking and provided them with smoking cessation materials.  - I informed the patient that smoking puts them at increased risk for cancer, COPD, hypertension, and more.  - Informed the patient to seek help if they begin to have trouble breathing, develop chest pain, start to cough up blood, feel faint, or pass out.  

## 2019-09-08 NOTE — Assessment & Plan Note (Signed)
-   The patient's hyperlipidemia is stable on a statin. - The patient will continue the current treatment regimen.  - I encouraged the patient to eat more vegetables and whole wheat, and to avoid fatty foods like whole milk, hard cheese, egg yolks, margarine, baked sweets, and fried foods.  - I encouraged the patient to live an active lifestyle and complete activities for 40 minutes at least three times per week.  - I instructed the patient to go to the ER if they begin having chest pain.

## 2019-09-08 NOTE — Assessment & Plan Note (Signed)
The patient has double vision when he is looking from both eyes. If this does not resolve, we will refer him to an eye doctor.

## 2019-09-08 NOTE — Assessment & Plan Note (Signed)
The patient has ataxic gait. He will come back early next week for recheck.

## 2019-09-08 NOTE — Assessment & Plan Note (Signed)
-   The patient's GERD is stable on medication.  - Instructed the patient to avoid eating spicy and acidic foods, as well as foods high in fat. - Instructed the patient to avoid eating large meals or meals 2-3 hours prior to sleeping. 

## 2019-09-08 NOTE — Assessment & Plan Note (Signed)
-   Today, the patient's blood pressure is well managed on Losartan-HCTZ. - The patient will continue the current treatment regimen.  - I encouraged the patient to eat a low-sodium diet to help control blood pressure. - I encouraged the patient to live an active lifestyle and complete activities that increases heart rate to 85% target heart rate at least 5 times per week for one hour.

## 2019-09-15 ENCOUNTER — Encounter: Payer: Self-pay | Admitting: Internal Medicine

## 2019-09-15 ENCOUNTER — Other Ambulatory Visit: Payer: Self-pay

## 2019-09-15 ENCOUNTER — Ambulatory Visit (INDEPENDENT_AMBULATORY_CARE_PROVIDER_SITE_OTHER): Payer: PRIVATE HEALTH INSURANCE | Admitting: Internal Medicine

## 2019-09-15 VITALS — BP 140/95 | HR 102 | Ht 68.0 in | Wt 139.1 lb

## 2019-09-15 DIAGNOSIS — Z720 Tobacco use: Secondary | ICD-10-CM | POA: Diagnosis not present

## 2019-09-15 DIAGNOSIS — E785 Hyperlipidemia, unspecified: Secondary | ICD-10-CM | POA: Diagnosis not present

## 2019-09-15 DIAGNOSIS — R260 Ataxic gait: Secondary | ICD-10-CM | POA: Diagnosis not present

## 2019-09-15 DIAGNOSIS — S0291XD Unspecified fracture of skull, subsequent encounter for fracture with routine healing: Secondary | ICD-10-CM | POA: Diagnosis not present

## 2019-09-15 DIAGNOSIS — S0990XD Unspecified injury of head, subsequent encounter: Secondary | ICD-10-CM

## 2019-09-15 DIAGNOSIS — S069X9D Unspecified intracranial injury with loss of consciousness of unspecified duration, subsequent encounter: Secondary | ICD-10-CM

## 2019-09-15 NOTE — Assessment & Plan Note (Signed)
Pt is trying to quit smoking. He has stopped drinking completely.

## 2019-09-15 NOTE — Assessment & Plan Note (Signed)
Pt is on statin.

## 2019-09-15 NOTE — Assessment & Plan Note (Signed)
He still has a slow, ataxic gait, which is improving. There is no focal neurological sign.

## 2019-09-15 NOTE — Progress Notes (Signed)
Established Patient Office Visit  SUBJECTIVE:  Subjective  Patient ID: Darrell [name], male    DOB: Aug 22, 1960  Age: 59 y.o. MRN: [id number]  CC:  Chief Complaint  Patient presents with   Follow-up    Patient is here for a 1 week follow up from a head injury that has caused dizziness. Patient reports he is here for evaluation if he can return back to work.     HPI Darrell [name] is a 59 y.o. male presenting today for a follow up to his recent head injury.   Darrell [name] is now 14 days out from his head injury due to a fall. He continues to have dizzy spells. He states that he is ok when sitting still, but when he moves, his vision becomes blurry and he gets dizzy. He states that his vision doubles. He denies nausea and vomiting. He has to walk with assistance as his gait is very unbalanced.   He needs a note for work stating how long he thinks he will be out for. He drives a forklift. He also does a lot of walking and moving around for his job, which at this time is not possible for him.   He states that he has quit drinking. He drank heavily, but he doesn't want to go back to that lifestyle. He does continue smoking cigars, but he has cut down considerably from what he used to smoke.    Past Medical History:  Diagnosis Date   Cancer [facility])    Hypertension     Past Surgical History:  Procedure Laterality Date   LEFT HEART CATH AND CORONARY ANGIOGRAPHY N/A 09/08/2018   Procedure: LEFT HEART CATH AND CORONARY ANGIOGRAPHY;  Surgeon: Corey Skains, MD;  Location: [place] [facility];  Service: Cardiovascular;  Laterality: N/A;   ROTATOR CUFF REPAIR      Family History  Problem Relation Age of Onset   Cancer Mother    Cancer Father    Hypertension Brother    Cancer Maternal Aunt    Cancer Paternal Aunt    Cancer Paternal Grandfather     Social History   Socioeconomic History   Marital status: Legally Separated    Spouse name: Not on file   Number of children:  Not on file   Years of education: Not on file   Highest education level: Not on file  Occupational History   Not on file  Tobacco Use   Smoking status: Current Some Day Smoker    Types: Cigars   Smokeless tobacco: Never Used   Tobacco comment: 3 per week  Substance and Sexual Activity   Alcohol use: Yes    Alcohol/week: 3.0 standard drinks    Types: 3 Cans of beer per week    Comment: daily   Drug use: Not Currently    Types: Cocaine    Comment: Patient quit 10 yrs ago   Sexual activity: Yes  Other Topics Concern   Not on file  Social History Narrative   Not on file   Social Determinants of Health   Financial Resource Strain:    Difficulty of Paying Living Expenses:   Food Insecurity:    Worried About Charity fundraiser in the Last Year:    Arboriculturist in the Last Year:   Transportation Needs:    Film/video editor (Medical):    Lack of Transportation (Non-Medical):   Physical Activity:    Days of Exercise per Week:  Minutes of Exercise per Session:   Stress:    Feeling of Stress :   Social Connections:    Frequency of Communication with Friends and Family:    Frequency of Social Gatherings with Friends and Family:    Attends Religious Services:    Active Member of Clubs or Organizations:    Attends Music therapist:    Marital Status:   Intimate Partner Violence:    Fear of Current or Ex-Partner:    Emotionally Abused:    Physically Abused:    Sexually Abused:      Current Outpatient Medications:    aspirin EC 81 MG EC tablet, Take 1 tablet (81 mg total) by mouth daily. (Patient not taking: Reported on 09/02/2019), Disp: 120 tablet, Rfl: 0   atorvastatin (LIPITOR) 40 MG tablet, Take 1 tablet (40 mg total) by mouth daily at 6 PM. (Patient taking differently: Take 40 mg by mouth daily. ), Disp: 30 tablet, Rfl: 0   losartan-hydrochlorothiazide (HYZAAR) 50-12.5 MG tablet, Take 1 tablet by mouth daily., Disp:  30 tablet, Rfl: 1   meclizine (ANTIVERT) 25 MG tablet, Take 1 tablet (25 mg total) by mouth 3 (three) times daily as needed for dizziness., Disp: 30 tablet, Rfl: 0   metoprolol tartrate (LOPRESSOR) 25 MG tablet, Take 1 tablet (25 mg total) by mouth 2 (two) times daily. (Patient not taking: Reported on 09/02/2019), Disp: 60 tablet, Rfl: 0   nicotine (NICODERM CQ) 21 mg/24hr patch, Place 1 patch (21 mg total) onto the skin daily. (Patient not taking: Reported on 09/02/2019), Disp: 28 patch, Rfl: 0   omeprazole (PRILOSEC) 20 MG capsule, Take 1 capsule (20 mg total) by mouth daily., Disp: 30 capsule, Rfl: 1   oxyCODONE-acetaminophen (PERCOCET) 5-325 MG tablet, Take 1 tablet by mouth 2 (two) times daily as needed., Disp: 20 tablet, Rfl: 0   Allergies  Allergen Reactions   Penicillins Hives    Did it involve swelling of the face/tongue/throat, SOB, or low BP? No Did it involve sudden or severe rash/hives, skin peeling, or any reaction on the inside of your mouth or nose? No Did you need to seek medical attention at a hospital or doctor's office? No When did it last happen? If all above answers are NO, may proceed with cephalosporin use.     ROS Review of Systems  Constitutional: Negative.   HENT: Negative.   Eyes: Positive for blurred vision and visual disturbance.       Vision doubling  Respiratory: Negative.   Cardiovascular: Negative.   Gastrointestinal: Negative.  Negative for vomiting.  Endocrine: Negative.   Genitourinary: Negative.   Musculoskeletal: Negative.   Skin: Negative.   Allergic/Immunologic: Negative.   Neurological: Positive for dizziness and headaches. Negative for seizures.  Hematological: Negative.   Psychiatric/Behavioral: Negative.   All other systems reviewed and are negative.    OBJECTIVE:    Physical Exam Vitals reviewed.  Constitutional:      Appearance: Normal appearance.  HENT:     Mouth/Throat:     Mouth: Mucous membranes are moist.    Eyes:     Pupils: Pupils are equal, round, and reactive to light.  Neck:     Vascular: No carotid bruit.  Cardiovascular:     Rate and Rhythm: Normal rate and regular rhythm.     Pulses: Normal pulses.     Heart sounds: Normal heart sounds.  Pulmonary:     Effort: Pulmonary effort is normal.     Breath sounds: Normal  breath sounds.  Abdominal:     General: Bowel sounds are normal.     Palpations: Abdomen is soft. There is no hepatomegaly, splenomegaly or mass.     Tenderness: There is no abdominal tenderness.     Hernia: No hernia is present.  Musculoskeletal:     Cervical back: Neck supple.     Right lower leg: No edema.     Left lower leg: No edema.  Skin:    Findings: No rash.  Neurological:     Mental Status: He is alert and oriented to person, place, and time.     Motor: No weakness.     Gait: Gait abnormal.  Psychiatric:        Mood and Affect: Mood normal.        Behavior: Behavior normal.     BP (!) 140/95    Pulse (!) 102    Ht 5\' 8"  (1.727 m)    Wt 139 lb 1.6 oz (63.1 kg)    BMI 21.15 kg/m  Wt Readings from Last 3 Encounters:  09/15/19 139 lb 1.6 oz (63.1 kg)  09/08/19 146 lb 11.2 oz (66.5 kg)  09/02/19 145 lb 1 oz (65.8 kg)    Health Maintenance Due  Topic Date Due   Hepatitis C Screening  Never done   COVID-19 Vaccine (1) Never done   TETANUS/TDAP  Never done   COLONOSCOPY  Never done   INFLUENZA VACCINE  09/06/2019    There are no preventive care reminders to display for this patient.  CBC Latest Ref Rng & Units 09/02/2019 08/27/2019 02/13/2019  WBC 4.0 - 10.5 K/uL 8.3 9.2 10.2  Hemoglobin 13.0 - 17.0 g/dL 13.7 11.9(L) 15.1  Hematocrit 39 - 52 % 39.6 34.5(L) 44.0  Platelets 150 - 400 K/uL 348 311 337   CMP Latest Ref Rng & Units 09/02/2019 08/27/2019 02/13/2019  Glucose 70 - 99 mg/dL 108(H) 93 88  BUN 6 - 20 mg/dL 14 22(H) 26(H)  Creatinine 0.61 - 1.24 mg/dL 0.97 1.12 1.14  Sodium 135 - 145 mmol/L 141 139 139  Potassium 3.5 - 5.1 mmol/L 3.5  3.4(L) 3.8  Chloride 98 - 111 mmol/L 102 106 105  CO2 22 - 32 mmol/L 27 21(L) 20(L)  Calcium 8.9 - 10.3 mg/dL 9.5 9.1 9.5  Total Protein 6.5 - 8.1 g/dL 8.0 7.4 -  Total Bilirubin 0.3 - 1.2 mg/dL 0.9 1.0 -  Alkaline Phos 38 - 126 U/L 81 67 -  AST 15 - 41 U/L 21 24 -  ALT 0 - 44 U/L 21 15 -    No results found for: TSH Lab Results  Component Value Date   ALBUMIN 4.2 09/02/2019   ANIONGAP 12 09/02/2019   No results found for: CHOL, HDL, LDLCALC, CHOLHDL No results found for: TRIG No results found for: HGBA1C    ASSESSMENT & PLAN:   Problem List Items Addressed This Visit      Musculoskeletal and Integument   Head injury with skull fracture ([place])    Pt has a head injury with a fracture as well as a hematoma. He has ataxic gait. He denies any history of nausea, vomiting, or severe headache.         Other   Tobacco abuse - Primary    Pt is trying to quit smoking. He has stopped drinking completely.       Ataxic gait    He still has a slow, ataxic gait, which is improving. There is no focal  neurological sign.       Dyslipidemia    Pt is on statin.          No orders of the defined types were placed in this encounter.   Follow-up: No follow-ups on file.    Dr. Jane Canary [facility] [address], [place], [place] [address]   By signing my name below, I, [employer], attest that this documentation has been prepared under the direction and in the presence of Cletis Athens, MD. Electronically Signed: Cletis Athens, MD 09/15/19, 11:08 AM   I personally performed the services described in this documentation, which was SCRIBED in my presence. The recorded information has been reviewed and considered accurate. It has been edited as necessary during review. Cletis Athens, MD

## 2019-09-15 NOTE — Assessment & Plan Note (Signed)
Pt has a head injury with a fracture as well as a hematoma. He has ataxic gait. He denies any history of nausea, vomiting, or severe headache.

## 2019-09-28 ENCOUNTER — Encounter: Payer: Self-pay | Admitting: Internal Medicine

## 2019-09-28 ENCOUNTER — Other Ambulatory Visit: Payer: Self-pay

## 2019-09-28 ENCOUNTER — Ambulatory Visit (INDEPENDENT_AMBULATORY_CARE_PROVIDER_SITE_OTHER): Payer: PRIVATE HEALTH INSURANCE | Admitting: Internal Medicine

## 2019-09-28 VITALS — BP 141/92 | HR 78 | Ht 68.0 in | Wt 146.9 lb

## 2019-09-28 DIAGNOSIS — R260 Ataxic gait: Secondary | ICD-10-CM

## 2019-09-28 DIAGNOSIS — Z720 Tobacco use: Secondary | ICD-10-CM | POA: Diagnosis not present

## 2019-09-28 DIAGNOSIS — I10 Essential (primary) hypertension: Secondary | ICD-10-CM

## 2019-09-28 DIAGNOSIS — S065X9D Traumatic subdural hemorrhage with loss of consciousness of unspecified duration, subsequent encounter: Secondary | ICD-10-CM | POA: Diagnosis not present

## 2019-09-28 DIAGNOSIS — S065XAA Traumatic subdural hemorrhage with loss of consciousness status unknown, initial encounter: Secondary | ICD-10-CM

## 2019-09-28 DIAGNOSIS — S065X9A Traumatic subdural hemorrhage with loss of consciousness of unspecified duration, initial encounter: Secondary | ICD-10-CM

## 2019-09-28 NOTE — Assessment & Plan Note (Signed)
-   I instructed the patient to stop smoking and provided them with smoking cessation materials.  - I informed the patient that smoking puts them at increased risk for cancer, COPD, hypertension, and more.  - Informed the patient to seek help if they begin to have trouble breathing, develop chest pain, start to cough up blood, feel faint, or pass out.  

## 2019-09-28 NOTE — Assessment & Plan Note (Signed)
-   Today, the patient's blood pressure is well managed on metoprolol and  ARRBS. - The patient will continue the current treatment regimen.  - I encouraged the patient to eat a low-sodium diet to help control blood pressure. - I encouraged the patient to live an active lifestyle and complete activities that increases heart rate to 85% target heart rate at least 5 times per week for one hour.

## 2019-09-28 NOTE — Assessment & Plan Note (Signed)
Patient has a diplopia on the left side.  Complains of mild headache he still is ataxic can walk with a stick.  And is alert and responsive.  No cognitive deficit

## 2019-09-28 NOTE — Progress Notes (Signed)
Patient ID: Darrell [name], male   DOB: 02-19-1960, 59 y.o.   MRN: [id number]    Established Patient Office Visit  Subjective:  Patient ID: Darrell [name], male    DOB: 09-04-1960  Age: 59 y.o. MRN: [id number]  CC:  Chief Complaint  Patient presents with  . Head Injury    patient scheduled for CT on 10/05/19    HPI  Darrell [name] presents for a follow up regarding his head injury. He states that "nothing has changed" and that he has a left-sided headache and blurry vision. He sees double in his left eye and gets dizzy when he walks. He smokes "a bit" and does not drink anymore. He is seeing a neurologist on 10/06/2019 and has a head CT without contrast scheduled for 10/05/2019. Denies seizures.  The patient is accompanied by a family member today.  Past Medical History:  Diagnosis Date  . Cancer ([place])   . Hypertension     Past Surgical History:  Procedure Laterality Date  . LEFT HEART CATH AND CORONARY ANGIOGRAPHY N/A 09/08/2018   Procedure: LEFT HEART CATH AND CORONARY ANGIOGRAPHY;  Surgeon: Corey Skains, MD;  Location: [facility];  Service: Cardiovascular;  Laterality: N/A;  . ROTATOR CUFF REPAIR      Family History  Problem Relation Age of Onset  . Cancer Mother   . Cancer Father   . Hypertension Brother   . Cancer Maternal Aunt   . Cancer Paternal Aunt   . Cancer Paternal Grandfather     Social History   Socioeconomic History  . Marital status: Legally Separated    Spouse name: Not on file  . Number of children: Not on file  . Years of education: Not on file  . Highest education level: Not on file  Occupational History  . Not on file  Tobacco Use  . Smoking status: Current Some Day Smoker    Types: Cigars  . Smokeless tobacco: Never Used  . Tobacco comment: 3 per week  Substance and Sexual Activity  . Alcohol use: Yes    Alcohol/week: 3.0 standard drinks    Types: 3 Cans of beer per week    Comment: daily  . Drug use: Not Currently     Types: Cocaine    Comment: Patient quit 10 yrs ago  . Sexual activity: Yes  Other Topics Concern  . Not on file  Social History Narrative  . Not on file   Social Determinants of Health   Financial Resource Strain:   . Difficulty of Paying Living Expenses: Not on file  Food Insecurity:   . Worried About Charity fundraiser in the Last Year: Not on file  . Ran Out of Food in the Last Year: Not on file  Transportation Needs:   . Lack of Transportation (Medical): Not on file  . Lack of Transportation (Non-Medical): Not on file  Physical Activity:   . Days of Exercise per Week: Not on file  . Minutes of Exercise per Session: Not on file  Stress:   . Feeling of Stress : Not on file  Social Connections:   . Frequency of Communication with Friends and Family: Not on file  . Frequency of Social Gatherings with Friends and Family: Not on file  . Attends Religious Services: Not on file  . Active Member of Clubs or Organizations: Not on file  . Attends Archivist Meetings: Not on file  . Marital Status: Not on file  Intimate Partner  Violence:   . Fear of Current or Ex-Partner: Not on file  . Emotionally Abused: Not on file  . Physically Abused: Not on file  . Sexually Abused: Not on file     Current Outpatient Medications:  .  aspirin EC 81 MG EC tablet, Take 1 tablet (81 mg total) by mouth daily., Disp: 120 tablet, Rfl: 0 .  atorvastatin (LIPITOR) 40 MG tablet, Take 1 tablet (40 mg total) by mouth daily at 6 PM. (Patient taking differently: Take 40 mg by mouth daily. ), Disp: 30 tablet, Rfl: 0 .  meclizine (ANTIVERT) 25 MG tablet, Take 1 tablet (25 mg total) by mouth 3 (three) times daily as needed for dizziness., Disp: 30 tablet, Rfl: 0 .  metoprolol tartrate (LOPRESSOR) 25 MG tablet, Take 1 tablet (25 mg total) by mouth 2 (two) times daily., Disp: 60 tablet, Rfl: 0 .  nicotine (NICODERM CQ) 21 mg/24hr patch, Place 1 patch (21 mg total) onto the skin daily., Disp: 28 patch,  Rfl: 0 .  omeprazole (PRILOSEC) 20 MG capsule, Take 1 capsule (20 mg total) by mouth daily., Disp: 30 capsule, Rfl: 1 .  oxyCODONE-acetaminophen (PERCOCET) 5-325 MG tablet, Take 1 tablet by mouth 2 (two) times daily as needed., Disp: 20 tablet, Rfl: 0 .  losartan-hydrochlorothiazide (HYZAAR) 50-12.5 MG tablet, Take 1 tablet by mouth daily., Disp: 30 tablet, Rfl: 1   Allergies  Allergen Reactions  . Penicillins Hives    Did it involve swelling of the face/tongue/throat, SOB, or low BP? No Did it involve sudden or severe rash/hives, skin peeling, or any reaction on the inside of your mouth or nose? No Did you need to seek medical attention at a hospital or doctor's office? No When did it last happen? If all above answers are "NO", may proceed with cephalosporin use.     ROS Review of Systems  Eyes: Positive for visual disturbance (blurry vision. double vision in left eye.).  Neurological: Positive for dizziness and headaches (left sided). Negative for seizures.  All other systems reviewed and are negative.     Objective:    Physical Exam Constitutional:      General: He is not in acute distress.    Appearance: He is not diaphoretic.  Cardiovascular:     Heart sounds: Murmur (grade 1 systolic murmer at the left sternal border.) heard.   Pulmonary:     Effort: Pulmonary effort is normal.     Breath sounds: Normal breath sounds.  Abdominal:     Palpations: Abdomen is soft.  Musculoskeletal:        General: No swelling or tenderness. Normal range of motion.     Cervical back: Normal range of motion and neck supple.  Skin:    General: Skin is warm and dry.  Neurological:     Mental Status: He is alert.     Gait: Gait abnormal (unsteady).     Comments: Ambulates with a cane. Sees double in his left eye.  Psychiatric:        Behavior: Behavior normal.        Thought Content: Thought content normal.        Judgment: Judgment normal.     BP (!) 141/92   Pulse 78    Ht 5\' 8"  (1.727 m)   Wt 146 lb 14.4 oz (66.6 kg)   BMI 22.34 kg/m  Wt Readings from Last 3 Encounters:  09/28/19 146 lb 14.4 oz (66.6 kg)  09/15/19 139 lb 1.6 oz (63.1 kg)  09/08/19 146 lb 11.2 oz (66.5 kg)     Health Maintenance Due  Topic Date Due  . Hepatitis C Screening  Never done  . COVID-19 Vaccine (1) Never done  . TETANUS/TDAP  Never done  . COLONOSCOPY  Never done  . INFLUENZA VACCINE  09/06/2019    There are no preventive care reminders to display for this patient.  No results found for: TSH Lab Results  Component Value Date   WBC 8.3 09/02/2019   HGB 13.7 09/02/2019   HCT 39.6 09/02/2019   MCV 95.0 09/02/2019   PLT 348 09/02/2019   Lab Results  Component Value Date   NA 141 09/02/2019   K 3.5 09/02/2019   CO2 27 09/02/2019   GLUCOSE 108 (H) 09/02/2019   BUN 14 09/02/2019   CREATININE 0.97 09/02/2019   BILITOT 0.9 09/02/2019   ALKPHOS 81 09/02/2019   AST 21 09/02/2019   ALT 21 09/02/2019   PROT 8.0 09/02/2019   ALBUMIN 4.2 09/02/2019   CALCIUM 9.5 09/02/2019   ANIONGAP 12 09/02/2019   No results found for: CHOL No results found for: HDL No results found for: LDLCALC No results found for: TRIG No results found for: CHOLHDL No results found for: HGBA1C    Assessment & Plan:   Problem List Items Addressed This Visit      Cardiovascular and Mediastinum   Essential hypertension    - Today, the patient's blood pressure is well managed on metoprolol and  ARRBS. - The patient will continue the current treatment regimen.  - I encouraged the patient to eat a low-sodium diet to help control blood pressure. - I encouraged the patient to live an active lifestyle and complete activities that increases heart rate to 85% target heart rate at least 5 times per week for one hour.            Nervous and Auditory   Subdural hematoma ([facility]) - Primary    Patient has a diplopia on the left side.  Complains of mild headache he still is ataxic can walk with  a stick.  And is alert and responsive.  No cognitive deficit        Other   Tobacco abuse    - I instructed the patient to stop smoking and provided them with smoking cessation materials.  - I informed the patient that smoking puts them at increased risk for cancer, COPD, hypertension, and more.  - Informed the patient to seek help if they begin to have trouble breathing, develop chest pain, start to cough up blood, feel faint, or pass out.        Ataxic gait    Refer the patient to the neurologist   scheduled for CT scan  Of head.         No orders of the defined types were placed in this encounter.   Follow-up: No follow-ups on file.    By signing my name below, I, [employer], attest that this documentation has been prepared under the direction and in the presence of Cletis Athens, MD. Electronically Signed: De Burrs, Medical Scribe. 09/28/19. 2:37 PM.  I personally performed the services described in this documentation, which was SCRIBED in my presence. The recorded information has been reviewed and considered accurate. It has been edited as necessary during review. Cletis Athens, MD

## 2019-09-28 NOTE — Assessment & Plan Note (Signed)
Refer the patient to the neurologist   scheduled for CT scan  Of head.

## 2019-10-05 ENCOUNTER — Ambulatory Visit
Admission: RE | Admit: 2019-10-05 | Discharge: 2019-10-05 | Disposition: A | Discharge status: Home / Self Care | Payer: PRIVATE HEALTH INSURANCE | Source: Ambulatory Visit | Attending: Nurse Practitioner | Admitting: Nurse Practitioner

## 2019-10-05 ENCOUNTER — Other Ambulatory Visit: Payer: Self-pay

## 2019-10-05 DIAGNOSIS — S020XXD Fracture of vault of skull, subsequent encounter for fracture with routine healing: Secondary | ICD-10-CM | POA: Diagnosis present

## 2019-10-05 DIAGNOSIS — S065X9A Traumatic subdural hemorrhage with loss of consciousness of unspecified duration, initial encounter: Secondary | ICD-10-CM | POA: Insufficient documentation

## 2019-10-05 DIAGNOSIS — S0219XD Other fracture of base of skull, subsequent encounter for fracture with routine healing: Secondary | ICD-10-CM | POA: Insufficient documentation

## 2019-10-05 DIAGNOSIS — S065XAA Traumatic subdural hemorrhage with loss of consciousness status unknown, initial encounter: Secondary | ICD-10-CM

## 2019-10-05 DIAGNOSIS — S065X9D Traumatic subdural hemorrhage with loss of consciousness of unspecified duration, subsequent encounter: Secondary | ICD-10-CM | POA: Insufficient documentation

## 2019-10-05 DIAGNOSIS — S065XAD Traumatic subdural hemorrhage with loss of consciousness status unknown, subsequent encounter: Secondary | ICD-10-CM

## 2019-10-09 ENCOUNTER — Other Ambulatory Visit: Payer: Self-pay | Admitting: Nurse Practitioner

## 2019-10-09 DIAGNOSIS — R42 Dizziness and giddiness: Secondary | ICD-10-CM

## 2019-10-14 ENCOUNTER — Ambulatory Visit (INDEPENDENT_AMBULATORY_CARE_PROVIDER_SITE_OTHER): Payer: PRIVATE HEALTH INSURANCE | Admitting: Internal Medicine

## 2019-10-14 ENCOUNTER — Encounter: Payer: Self-pay | Admitting: Internal Medicine

## 2019-10-14 ENCOUNTER — Other Ambulatory Visit: Payer: Self-pay

## 2019-10-14 VITALS — BP 140/97 | HR 95 | Ht 68.0 in | Wt 149.9 lb

## 2019-10-14 DIAGNOSIS — I10 Essential (primary) hypertension: Secondary | ICD-10-CM | POA: Diagnosis not present

## 2019-10-14 DIAGNOSIS — S065X9A Traumatic subdural hemorrhage with loss of consciousness of unspecified duration, initial encounter: Secondary | ICD-10-CM

## 2019-10-14 DIAGNOSIS — S069X9D Unspecified intracranial injury with loss of consciousness of unspecified duration, subsequent encounter: Secondary | ICD-10-CM

## 2019-10-14 DIAGNOSIS — Z720 Tobacco use: Secondary | ICD-10-CM | POA: Diagnosis not present

## 2019-10-14 DIAGNOSIS — S0291XD Unspecified fracture of skull, subsequent encounter for fracture with routine healing: Secondary | ICD-10-CM

## 2019-10-14 DIAGNOSIS — S065XAA Traumatic subdural hemorrhage with loss of consciousness status unknown, initial encounter: Secondary | ICD-10-CM

## 2019-10-14 DIAGNOSIS — R260 Ataxic gait: Secondary | ICD-10-CM

## 2019-10-14 NOTE — Progress Notes (Signed)
Established Patient Office Visit  SUBJECTIVE:  Subjective  Patient ID: Darrell [name], male    DOB: 03-Nov-1960  Age: 59 y.o. MRN: [id number]  CC:  Chief Complaint  Patient presents with  . subdural hematoma    HPI Darrell [name] is a 59 y.o. male presenting today for reevaluation of recent subdural hematoma.   Repeat CT Head was completed on 10/05/2019 revealing a resolution of the previously noted subdural hematoma.   He states that he feels unchanged. He was seen by Neurosurgery on 10/08/2019 by Dr. Izora Ribas. Dr. Izora Ribas recommended an appointment with Neurology and ENT. He has an appointment with ENT on 10/23/2019. He does not have an appointment with neurology at this time. He was also recommended for a CT of the cervical spine on 10/30/2019. He does not need to follow up with Neurosurgery unless CT cervical spine is revealing for any pathology.   He continues to take his medication as directed.   Past Medical History:  Diagnosis Date  . Cancer ([place])   . Hypertension     Past Surgical History:  Procedure Laterality Date  . LEFT HEART CATH AND CORONARY ANGIOGRAPHY N/A 09/08/2018   Procedure: LEFT HEART CATH AND CORONARY ANGIOGRAPHY;  Surgeon: Corey Skains, MD;  Location: [facility];  Service: Cardiovascular;  Laterality: N/A;  . ROTATOR CUFF REPAIR      Family History  Problem Relation Age of Onset  . Cancer Mother   . Cancer Father   . Hypertension Brother   . Cancer Maternal Aunt   . Cancer Paternal Aunt   . Early death Paternal 36   . Cancer Paternal Grandfather     Social History   Socioeconomic History  . Marital status: Legally Separated    Spouse name: Not on file  . Number of children: Not on file  . Years of education: Not on file  . Highest education level: Not on file  Occupational History  . Not on file  Tobacco Use  . Smoking status: Current Some Day Smoker    Types: Cigars  . Smokeless tobacco: Never Used  . Tobacco  comment: 3 per week  Substance and Sexual Activity  . Alcohol use: Yes    Alcohol/week: 3.0 standard drinks    Types: 3 Cans of beer per week    Comment: daily  . Drug use: Not Currently    Types: Cocaine    Comment: Patient quit 10 yrs ago  . Sexual activity: Yes  Other Topics Concern  . Not on file  Social History Narrative  . Not on file   Social Determinants of Health   Financial Resource Strain:   . Difficulty of Paying Living Expenses: Not on file  Food Insecurity:   . Worried About Charity fundraiser in the Last Year: Not on file  . Ran Out of Food in the Last Year: Not on file  Transportation Needs:   . Lack of Transportation (Medical): Not on file  . Lack of Transportation (Non-Medical): Not on file  Physical Activity:   . Days of Exercise per Week: Not on file  . Minutes of Exercise per Session: Not on file  Stress:   . Feeling of Stress : Not on file  Social Connections:   . Frequency of Communication with Friends and Family: Not on file  . Frequency of Social Gatherings with Friends and Family: Not on file  . Attends Religious Services: Not on file  . Active Member of Clubs  or Organizations: Not on file  . Attends Archivist Meetings: Not on file  . Marital Status: Not on file  Intimate Partner Violence:   . Fear of Current or Ex-Partner: Not on file  . Emotionally Abused: Not on file  . Physically Abused: Not on file  . Sexually Abused: Not on file     Current Outpatient Medications:  .  aspirin EC 81 MG EC tablet, Take 1 tablet (81 mg total) by mouth daily., Disp: 120 tablet, Rfl: 0 .  atorvastatin (LIPITOR) 40 MG tablet, Take 1 tablet (40 mg total) by mouth daily at 6 PM. (Patient taking differently: Take 40 mg by mouth daily. ), Disp: 30 tablet, Rfl: 0 .  meclizine (ANTIVERT) 25 MG tablet, Take 1 tablet (25 mg total) by mouth 3 (three) times daily as needed for dizziness., Disp: 30 tablet, Rfl: 0 .  metoprolol tartrate (LOPRESSOR) 25 MG  tablet, Take 1 tablet (25 mg total) by mouth 2 (two) times daily., Disp: 60 tablet, Rfl: 0 .  nicotine (NICODERM CQ) 21 mg/24hr patch, Place 1 patch (21 mg total) onto the skin daily., Disp: 28 patch, Rfl: 0 .  omeprazole (PRILOSEC) 20 MG capsule, Take 1 capsule (20 mg total) by mouth daily., Disp: 30 capsule, Rfl: 1 .  oxyCODONE-acetaminophen (PERCOCET) 5-325 MG tablet, Take 1 tablet by mouth 2 (two) times daily as needed., Disp: 20 tablet, Rfl: 0 .  losartan-hydrochlorothiazide (HYZAAR) 50-12.5 MG tablet, Take 1 tablet by mouth daily., Disp: 30 tablet, Rfl: 1   Allergies  Allergen Reactions  . Penicillins Hives    Did it involve swelling of the face/tongue/throat, SOB, or low BP? No Did it involve sudden or severe rash/hives, skin peeling, or any reaction on the inside of your mouth or nose? No Did you need to seek medical attention at a hospital or doctor's office? No When did it last happen? If all above answers are "NO", may proceed with cephalosporin use.     ROS Review of Systems  Constitutional: Negative.   HENT: Negative.   Eyes: Positive for photophobia. Negative for visual disturbance.  Respiratory: Negative.   Cardiovascular: Negative.   Gastrointestinal: Negative.   Endocrine: Negative.   Genitourinary: Negative.   Musculoskeletal: Positive for gait problem (due to dizziness).  Skin: Negative.   Allergic/Immunologic: Negative.   Neurological: Positive for dizziness (constant) and headaches (intermittant).  Hematological: Negative.   Psychiatric/Behavioral: Negative.   All other systems reviewed and are negative.    OBJECTIVE:    Physical Exam Vitals reviewed.  Constitutional:      Appearance: Normal appearance.  HENT:     Mouth/Throat:     Mouth: Mucous membranes are moist.  Eyes:     Pupils: Pupils are equal, round, and reactive to light.     Slit lamp exam:    Right eye: Photophobia present.     Left eye: Photophobia present.  Neck:      Vascular: No carotid bruit.  Cardiovascular:     Rate and Rhythm: Normal rate and regular rhythm.     Pulses: Normal pulses.     Heart sounds: Normal heart sounds.  Pulmonary:     Effort: Pulmonary effort is normal.     Breath sounds: Normal breath sounds.  Abdominal:     General: Bowel sounds are normal.     Palpations: Abdomen is soft. There is no hepatomegaly, splenomegaly or mass.     Tenderness: There is no abdominal tenderness.     Hernia:  No hernia is present.  Musculoskeletal:     Cervical back: Neck supple.     Right lower leg: No edema.     Left lower leg: No edema.  Skin:    Findings: No rash.  Neurological:     Mental Status: He is alert and oriented to person, place, and time.     Motor: No weakness.     Coordination: Romberg sign positive.     Gait: Gait abnormal (ataxic).     Comments: Pt feels the need to look at the floor while walking, he feels like he is going to fall over.   Psychiatric:        Mood and Affect: Mood normal.        Behavior: Behavior normal.     BP (!) 140/97   Pulse 95   Ht 5\' 8"  (1.727 m)   Wt 149 lb 14.4 oz (68 kg)   BMI 22.79 kg/m  Wt Readings from Last 3 Encounters:  10/14/19 149 lb 14.4 oz (68 kg)  09/28/19 146 lb 14.4 oz (66.6 kg)  09/15/19 139 lb 1.6 oz (63.1 kg)    Health Maintenance Due  Topic Date Due  . Hepatitis C Screening  Never done  . COVID-19 Vaccine (1) Never done  . TETANUS/TDAP  Never done  . COLONOSCOPY  Never done  . INFLUENZA VACCINE  Never done    There are no preventive care reminders to display for this patient.  CBC Latest Ref Rng & Units 09/02/2019 08/27/2019 02/13/2019  WBC 4.0 - 10.5 K/uL 8.3 9.2 10.2  Hemoglobin 13.0 - 17.0 g/dL 13.7 11.9(L) 15.1  Hematocrit 39 - 52 % 39.6 34.5(L) 44.0  Platelets 150 - 400 K/uL 348 311 337   CMP Latest Ref Rng & Units 09/02/2019 08/27/2019 02/13/2019  Glucose 70 - 99 mg/dL 108(H) 93 88  BUN 6 - 20 mg/dL 14 22(H) 26(H)  Creatinine 0.61 - 1.24 mg/dL 0.97 1.12 1.14   Sodium 135 - 145 mmol/L 141 139 139  Potassium 3.5 - 5.1 mmol/L 3.5 3.4(L) 3.8  Chloride 98 - 111 mmol/L 102 106 105  CO2 22 - 32 mmol/L 27 21(L) 20(L)  Calcium 8.9 - 10.3 mg/dL 9.5 9.1 9.5  Total Protein 6.5 - 8.1 g/dL 8.0 7.4 -  Total Bilirubin 0.3 - 1.2 mg/dL 0.9 1.0 -  Alkaline Phos 38 - 126 U/L 81 67 -  AST 15 - 41 U/L 21 24 -  ALT 0 - 44 U/L 21 15 -    No results found for: TSH Lab Results  Component Value Date   ALBUMIN 4.2 09/02/2019   ANIONGAP 12 09/02/2019   No results found for: CHOL, HDL, LDLCALC, CHOLHDL No results found for: TRIG No results found for: HGBA1C    ASSESSMENT & PLAN:   Problem List Items Addressed This Visit      Cardiovascular and Mediastinum   Essential hypertension    - Today, the patient's blood pressure is well managed on amlodipine. - The patient will continue the current treatment regimen.  - I encouraged the patient to eat a low-sodium diet to help control blood pressure. - I encouraged the patient to live an active lifestyle and complete activities that increases heart rate to 85% target heart rate at least 5 times per week for one hour.  - Encouraged patient to stop drinking.            Nervous and Auditory   Subdural hematoma ([facility])  Musculoskeletal and Integument   Head injury with skull fracture ([place]) - Primary    Pt has seen the Neurosurgeon. He also has an appointment with ENT. He is awaiting an appointment with Neurology. His dyslopia has improved. His gait is still ataxic and he continues to have to use a cane to walk. Romberg test is positive. Motor strength is improving in both bilateral forearms and bilateral legs.       Relevant Orders   Ambulatory referral to Neurology     Other   Tobacco abuse    - I instructed the patient to stop smoking and provided them with smoking cessation materials.  - I informed the patient that smoking puts them at increased risk for cancer, COPD, hypertension, and more.  - Informed  the patient to seek help if they begin to have trouble breathing, develop chest pain, start to cough up blood, feel faint, or pass out.        Ataxic gait    Pt was advised to continue using a cane because his gait is unsteady.          No orders of the defined types were placed in this encounter.   Follow-up: Return in about 2 weeks (around 10/28/2019) for Follow Up.    Dr. Jane Canary [facility] [address], [place], [place] [address]   By signing my name below, I, [employer], attest that this documentation has been prepared under the direction and in the presence of Cletis Athens, MD. Electronically Signed: Cletis Athens, MD 10/14/19, 10:35 AM   I personally performed the services described in this documentation, which was SCRIBED in my presence. The recorded information has been reviewed and considered accurate. It has been edited as necessary during review. Cletis Athens, MD

## 2019-10-14 NOTE — Assessment & Plan Note (Signed)
Pt was advised to continue using a cane because his gait is unsteady.

## 2019-10-14 NOTE — Assessment & Plan Note (Signed)
Pt has seen the Neurosurgeon. He also has an appointment with ENT. He is awaiting an appointment with Neurology. His dyslopia has improved. His gait is still ataxic and he continues to have to use a cane to walk. Romberg test is positive. Motor strength is improving in both bilateral forearms and bilateral legs.

## 2019-10-14 NOTE — Assessment & Plan Note (Signed)
-   Today, the patient's blood pressure is well managed on amlodipine. - The patient will continue the current treatment regimen.  - I encouraged the patient to eat a low-sodium diet to help control blood pressure. - I encouraged the patient to live an active lifestyle and complete activities that increases heart rate to 85% target heart rate at least 5 times per week for one hour.  - Encouraged patient to stop drinking.

## 2019-10-14 NOTE — Assessment & Plan Note (Signed)
-   I instructed the patient to stop smoking and provided them with smoking cessation materials.  - I informed the patient that smoking puts them at increased risk for cancer, COPD, hypertension, and more.  - Informed the patient to seek help if they begin to have trouble breathing, develop chest pain, start to cough up blood, feel faint, or pass out.  

## 2019-10-28 ENCOUNTER — Ambulatory Visit: Payer: PRIVATE HEALTH INSURANCE | Admitting: Internal Medicine

## 2019-10-30 ENCOUNTER — Other Ambulatory Visit: Payer: Self-pay

## 2019-10-30 ENCOUNTER — Ambulatory Visit
Admission: RE | Admit: 2019-10-30 | Discharge: 2019-10-30 | Disposition: A | Discharge status: Home / Self Care | Payer: PRIVATE HEALTH INSURANCE | Source: Ambulatory Visit | Attending: Nurse Practitioner | Admitting: Nurse Practitioner

## 2019-10-30 DIAGNOSIS — R42 Dizziness and giddiness: Secondary | ICD-10-CM

## 2019-11-03 ENCOUNTER — Encounter: Payer: Self-pay | Admitting: Internal Medicine

## 2019-11-03 ENCOUNTER — Ambulatory Visit (INDEPENDENT_AMBULATORY_CARE_PROVIDER_SITE_OTHER): Payer: PRIVATE HEALTH INSURANCE | Admitting: Internal Medicine

## 2019-11-03 ENCOUNTER — Other Ambulatory Visit: Payer: Self-pay

## 2019-11-03 VITALS — BP 137/91 | HR 88 | Ht 68.0 in | Wt 156.4 lb

## 2019-11-03 DIAGNOSIS — I10 Essential (primary) hypertension: Secondary | ICD-10-CM | POA: Diagnosis not present

## 2019-11-03 DIAGNOSIS — K219 Gastro-esophageal reflux disease without esophagitis: Secondary | ICD-10-CM | POA: Diagnosis not present

## 2019-11-03 DIAGNOSIS — F1010 Alcohol abuse, uncomplicated: Secondary | ICD-10-CM | POA: Diagnosis not present

## 2019-11-03 DIAGNOSIS — S065X5D Traumatic subdural hemorrhage with loss of consciousness greater than 24 hours with return to pre-existing conscious level, subsequent encounter: Secondary | ICD-10-CM

## 2019-11-03 DIAGNOSIS — Z720 Tobacco use: Secondary | ICD-10-CM | POA: Diagnosis not present

## 2019-11-03 NOTE — Assessment & Plan Note (Signed)
Patient was advised to stop drinking alcohol completely.

## 2019-11-03 NOTE — Assessment & Plan Note (Signed)
-   I instructed the patient to stop smoking and provided them with smoking cessation materials.  - I informed the patient that smoking puts them at increased risk for cancer, COPD, hypertension, and more.  - Informed the patient to seek help if they begin to have trouble breathing, develop chest pain, start to cough up blood, feel faint, or pass out.  

## 2019-11-03 NOTE — Progress Notes (Addendum)
Established Patient Office Visit  SUBJECTIVE:  Subjective  Patient ID: Darrell [name], male    DOB: [birth year]  Age: 59 y.o. MRN: [id number]  CC:  Chief Complaint  Patient presents with  . Head Injury    patient here today to discuss returning to work     HPI Darrell [name] is a 59 y.o. male presenting today for a reassessment of his head injury.   MRI on 10/30/2019 revealed no evidence of fracture. Mild foraminal narrowing bilaterally at C5-6. Moderate foraminal narrowing bilaterally C6-7 due to spurring. Mild foraminal narrowing bilaterally C7-T1.  He states that he wants to go back to work. He drives a forklift.   He states that his left shoulder is hurting him today because he slept on it weird. He has prior rotator cuff injury due to his work at a [employer], clinical.    Past Medical History:  Diagnosis Date  . Cancer ([place])   . Hypertension     Past Surgical History:  Procedure Laterality Date  . LEFT HEART CATH AND CORONARY ANGIOGRAPHY N/A 09/08/2018   Procedure: LEFT HEART CATH AND CORONARY ANGIOGRAPHY;  Surgeon: Corey Skains, MD;  Location: [facility];  Service: Cardiovascular;  Laterality: N/A;  . ROTATOR CUFF REPAIR      Family History  Problem Relation Age of Onset  . Cancer Mother   . Cancer Father   . Hypertension Brother   . Cancer Maternal Aunt   . Cancer Paternal Aunt   . Early death Paternal 77   . Cancer Paternal Grandfather     Social History   Socioeconomic History  . Marital status: Legally Separated    Spouse name: Not on file  . Number of children: Not on file  . Years of education: Not on file  . Highest education level: Not on file  Occupational History  . Not on file  Tobacco Use  . Smoking status: Current Some Day Smoker    Types: Cigars  . Smokeless tobacco: Never Used  . Tobacco comment: 3 per week  Substance and Sexual Activity  . Alcohol use: Yes    Alcohol/week: 3.0 standard drinks    Types: 3 Cans of beer  per week    Comment: daily  . Drug use: Not Currently    Types: Cocaine    Comment: Patient quit 10 yrs ago  . Sexual activity: Yes  Other Topics Concern  . Not on file  Social History Narrative  . Not on file   Social Determinants of Health   Financial Resource Strain:   . Difficulty of Paying Living Expenses: Not on file  Food Insecurity:   . Worried About Charity fundraiser in the Last Year: Not on file  . Ran Out of Food in the Last Year: Not on file  Transportation Needs:   . Lack of Transportation (Medical): Not on file  . Lack of Transportation (Non-Medical): Not on file  Physical Activity:   . Days of Exercise per Week: Not on file  . Minutes of Exercise per Session: Not on file  Stress:   . Feeling of Stress : Not on file  Social Connections:   . Frequency of Communication with Friends and Family: Not on file  . Frequency of Social Gatherings with Friends and Family: Not on file  . Attends Religious Services: Not on file  . Active Member of Clubs or Organizations: Not on file  . Attends Archivist Meetings: Not on file  .  Marital Status: Not on file  Intimate Partner Violence:   . Fear of Current or Ex-Partner: Not on file  . Emotionally Abused: Not on file  . Physically Abused: Not on file  . Sexually Abused: Not on file     Current Outpatient Medications:  .  aspirin EC 81 MG EC tablet, Take 1 tablet (81 mg total) by mouth daily., Disp: 120 tablet, Rfl: 0 .  atorvastatin (LIPITOR) 40 MG tablet, Take 1 tablet (40 mg total) by mouth daily at 6 PM. (Patient taking differently: Take 40 mg by mouth daily. ), Disp: 30 tablet, Rfl: 0 .  meclizine (ANTIVERT) 25 MG tablet, Take 1 tablet (25 mg total) by mouth 3 (three) times daily as needed for dizziness., Disp: 30 tablet, Rfl: 0 .  metoprolol tartrate (LOPRESSOR) 25 MG tablet, Take 1 tablet (25 mg total) by mouth 2 (two) times daily., Disp: 60 tablet, Rfl: 0 .  nicotine (NICODERM CQ) 21 mg/24hr patch, Place  1 patch (21 mg total) onto the skin daily., Disp: 28 patch, Rfl: 0 .  omeprazole (PRILOSEC) 20 MG capsule, Take 1 capsule (20 mg total) by mouth daily., Disp: 30 capsule, Rfl: 1 .  oxyCODONE-acetaminophen (PERCOCET) 5-325 MG tablet, Take 1 tablet by mouth 2 (two) times daily as needed., Disp: 20 tablet, Rfl: 0 .  losartan-hydrochlorothiazide (HYZAAR) 50-12.5 MG tablet, Take 1 tablet by mouth daily., Disp: 30 tablet, Rfl: 1   Allergies  Allergen Reactions  . Penicillins Hives    Did it involve swelling of the face/tongue/throat, SOB, or low BP? No Did it involve sudden or severe rash/hives, skin peeling, or any reaction on the inside of your mouth or nose? No Did you need to seek medical attention at a hospital or doctor's office? No When did it last happen? If all above answers are "NO", may proceed with cephalosporin use.     ROS Review of Systems  Constitutional: Negative.   HENT: Negative.   Eyes: Negative.   Respiratory: Negative.   Cardiovascular: Negative.   Gastrointestinal: Negative.   Endocrine: Negative.   Genitourinary: Negative.   Musculoskeletal: Positive for myalgias (L shoulder).  Skin: Negative.   Allergic/Immunologic: Negative.   Neurological: Negative.   Hematological: Negative.   Psychiatric/Behavioral: Negative.   All other systems reviewed and are negative.    OBJECTIVE:    Physical Exam Vitals reviewed.  Constitutional:      Appearance: Normal appearance.  HENT:     Mouth/Throat:     Mouth: Mucous membranes are moist.  Eyes:     Pupils: Pupils are equal, round, and reactive to light.  Neck:     Vascular: No carotid bruit.  Cardiovascular:     Rate and Rhythm: Normal rate and regular rhythm.     Pulses: Normal pulses.     Heart sounds: Normal heart sounds.  Pulmonary:     Effort: Pulmonary effort is normal.     Breath sounds: Normal breath sounds.  Abdominal:     General: Bowel sounds are normal.     Palpations: Abdomen is soft.  There is no hepatomegaly, splenomegaly or mass.     Tenderness: There is no abdominal tenderness.     Hernia: No hernia is present.  Musculoskeletal:     Cervical back: Neck supple.     Right lower leg: No edema.     Left lower leg: No edema.  Skin:    Findings: No rash.  Neurological:     Mental Status: He is alert  and oriented to person, place, and time.     Motor: No weakness.  Psychiatric:        Mood and Affect: Mood normal.        Behavior: Behavior normal.     BP (!) 137/91   Pulse 88   Ht 5\' 8"  (1.727 m)   Wt 156 lb 6.4 oz (70.9 kg)   BMI 23.78 kg/m  Wt Readings from Last 3 Encounters:  11/03/19 156 lb 6.4 oz (70.9 kg)  10/14/19 149 lb 14.4 oz (68 kg)  09/28/19 146 lb 14.4 oz (66.6 kg)    Health Maintenance Due  Topic Date Due  . Hepatitis C Screening  Never done  . COVID-19 Vaccine (1) Never done  . TETANUS/TDAP  Never done  . COLONOSCOPY  Never done  . INFLUENZA VACCINE  Never done    There are no preventive care reminders to display for this patient.  CBC Latest Ref Rng & Units 09/02/2019 08/27/2019 02/13/2019  WBC 4.0 - 10.5 K/uL 8.3 9.2 10.2  Hemoglobin 13.0 - 17.0 g/dL 13.7 11.9(L) 15.1  Hematocrit 39 - 52 % 39.6 34.5(L) 44.0  Platelets 150 - 400 K/uL 348 311 337   CMP Latest Ref Rng & Units 09/02/2019 08/27/2019 02/13/2019  Glucose 70 - 99 mg/dL 108(H) 93 88  BUN 6 - 20 mg/dL 14 22(H) 26(H)  Creatinine 0.61 - 1.24 mg/dL 0.97 1.12 1.14  Sodium 135 - 145 mmol/L 141 139 139  Potassium 3.5 - 5.1 mmol/L 3.5 3.4(L) 3.8  Chloride 98 - 111 mmol/L 102 106 105  CO2 22 - 32 mmol/L 27 21(L) 20(L)  Calcium 8.9 - 10.3 mg/dL 9.5 9.1 9.5  Total Protein 6.5 - 8.1 g/dL 8.0 7.4 -  Total Bilirubin 0.3 - 1.2 mg/dL 0.9 1.0 -  Alkaline Phos 38 - 126 U/L 81 67 -  AST 15 - 41 U/L 21 24 -  ALT 0 - 44 U/L 21 15 -    No results found for: TSH Lab Results  Component Value Date   ALBUMIN 4.2 09/02/2019   ANIONGAP 12 09/02/2019   No results found for: CHOL, HDL, LDLCALC,  CHOLHDL No results found for: TRIG No results found for: HGBA1C    ASSESSMENT & PLAN:   Problem List Items Addressed This Visit      Cardiovascular and Mediastinum   Essential hypertension - Primary    - Today, the patient's blood pressure is well managed on losartan. - The patient will continue the current treatment regimen.  - I encouraged the patient to eat a low-sodium diet to help control blood pressure. - I encouraged the patient to live an active lifestyle and complete activities that increases heart rate to 85% target heart rate at least 5 times per week for one hour.            Digestive   Gastroesophageal reflux disease without esophagitis    Patient was advised not to eat late at night and take Prilosec 20 mg p.o. at bedtime as needed.        Nervous and Auditory   Subdural hematoma due to concussion [facility])    Patient has seen a neurologist he does not have any have focal neurological sign on today's visit he can walk and bend his neck.  There is no nystagmus or irregularity of the neck.  He can go back to work.  This was discussed in front of his wife and she is agreeable and patient is also willing  to try to go back to work        Other   Tobacco abuse    - I instructed the patient to stop smoking and provided them with smoking cessation materials.  - I informed the patient that smoking puts them at increased risk for cancer, COPD, hypertension, and more.  - Informed the patient to seek help if they begin to have trouble breathing, develop chest pain, start to cough up blood, feel faint, or pass out.        Alcohol abuse    Patient was advised to stop drinking alcohol completely.         No orders of the defined types were placed in this encounter.   Follow-up: No follow-ups on file.    Cletis Athens, MD [facility] [address] [place], [place] [address]   By signing my name below, I, [employer], attest that this documentation  has been prepared under the direction and in the presence of Dr. Cletis Athens Electronically Signed: Cletis Athens, MD 11/04/19, 1:17 PM  I personally performed the services described in this documentation, which was SCRIBED in my presence. The recorded information has been reviewed and considered accurate. It has been edited as necessary during review. Cletis Athens, MD

## 2019-11-03 NOTE — Assessment & Plan Note (Signed)
-   Today, the patient's blood pressure is well managed on losartan. - The patient will continue the current treatment regimen.  - I encouraged the patient to eat a low-sodium diet to help control blood pressure. - I encouraged the patient to live an active lifestyle and complete activities that increases heart rate to 85% target heart rate at least 5 times per week for one hour.     

## 2019-11-03 NOTE — Assessment & Plan Note (Signed)
Patient was advised not to eat late at night and take Prilosec 20 mg p.o. at bedtime as needed.

## 2019-11-03 NOTE — Assessment & Plan Note (Signed)
Patient has seen a neurologist he does not have any have focal neurological sign on today's visit he can walk and bend his neck.  There is no nystagmus or irregularity of the neck.  He can go back to work.  This was discussed in front of his wife and she is agreeable and patient is also willing to try to go back to work

## 2019-11-17 ENCOUNTER — Ambulatory Visit (INDEPENDENT_AMBULATORY_CARE_PROVIDER_SITE_OTHER): Payer: PRIVATE HEALTH INSURANCE | Admitting: Internal Medicine

## 2019-11-17 ENCOUNTER — Encounter: Payer: Self-pay | Admitting: Internal Medicine

## 2019-11-17 ENCOUNTER — Other Ambulatory Visit: Payer: Self-pay

## 2019-11-17 VITALS — BP 169/108 | HR 82 | Ht 68.0 in | Wt 158.6 lb

## 2019-11-17 DIAGNOSIS — Z720 Tobacco use: Secondary | ICD-10-CM | POA: Diagnosis not present

## 2019-11-17 DIAGNOSIS — E785 Hyperlipidemia, unspecified: Secondary | ICD-10-CM

## 2019-11-17 DIAGNOSIS — F1010 Alcohol abuse, uncomplicated: Secondary | ICD-10-CM | POA: Diagnosis not present

## 2019-11-17 DIAGNOSIS — I10 Essential (primary) hypertension: Secondary | ICD-10-CM | POA: Diagnosis not present

## 2019-11-17 DIAGNOSIS — K219 Gastro-esophageal reflux disease without esophagitis: Secondary | ICD-10-CM

## 2019-11-17 MED ORDER — LOSARTAN POTASSIUM-HCTZ 50-12.5 MG PO TABS: 1.0000 | ORAL_TABLET | Freq: Every day | ORAL | 6 refills | Status: DC

## 2019-11-17 MED ORDER — OMEPRAZOLE 20 MG PO CPDR: 20.0000 mg | DELAYED_RELEASE_CAPSULE | Freq: Every day | ORAL | 0 refills | Status: AC

## 2019-11-17 MED ORDER — LOSARTAN POTASSIUM-HCTZ 50-12.5 MG PO TABS: 1.0000 | ORAL_TABLET | Freq: Every day | ORAL | 1 refills | Status: DC

## 2019-11-17 NOTE — Assessment & Plan Note (Signed)
-   I instructed the patient to stop smoking and provided them with smoking cessation materials.  - I informed the patient that smoking puts them at increased risk for cancer, COPD, hypertension, and more.  - Informed the patient to seek help if they begin to have trouble breathing, develop chest pain, start to cough up blood, feel faint, or pass out.  

## 2019-11-17 NOTE — Patient Instructions (Signed)
Smoking Tobacco Information, Adult Smoking tobacco can be harmful to your health. Tobacco contains a poisonous (toxic), colorless chemical called nicotine. Nicotine is addictive. It changes the brain and can make it hard to stop smoking. Tobacco also has other toxic chemicals that can hurt your body and raise your risk of many cancers.  How can smoking tobacco affect me? Smoking tobacco puts you at risk for:  Cancer. Smoking is most commonly associated with lung cancer, but can also lead to cancer in other parts of the body.  Chronic obstructive pulmonary disease (COPD). This is a long-term lung condition that makes it hard to breathe. It also gets worse over time.  High blood pressure (hypertension), heart disease, stroke, or heart attack.  Lung infections, such as pneumonia.  Cataracts. This is when the lenses in the eyes become clouded.  Digestive problems. This may include peptic ulcers, heartburn, and gastroesophageal reflux disease (GERD).  Oral health problems, such as gum disease and tooth loss.  Loss of taste and smell. Smoking can affect your appearance by causing:  Wrinkles.  Yellow or stained teeth, fingers, and fingernails. Smoking tobacco can also affect your social life, because: 1. It may be challenging to find places to smoke when away from home. Many workplaces, [employer], hotels, and public places are tobacco-free. 2. Smoking is expensive. This is due to the cost of tobacco and the long-term costs of treating health problems from smoking. 3. Secondhand smoke may affect those around you. Secondhand smoke can cause lung cancer, breathing problems, and heart disease. Children of smokers have a higher risk for: ? Sudden infant death syndrome (SIDS). ? Ear infections. ? Lung infections. If you currently smoke tobacco, quitting now can help you:  Lead a longer and healthier life.  Look, smell, breathe, and feel better over time.  Save money.  Protect others from  the harms of secondhand smoke. What actions can I take to prevent health problems? Quit smoking   Do not start smoking. Quit if you already do.  Make a plan to quit smoking and commit to it. Look for programs to help you and ask your health care provider for recommendations and ideas.  Set a date and write down all the reasons you want to quit.  Let your friends and family know you are quitting so they can help and support you. Consider finding friends who also want to quit. It can be easier to quit with someone else, so that you can support each other.  Talk with your health care provider about using nicotine replacement medicines to help you quit, such as gum, lozenges, patches, sprays, or pills.  Do not replace cigarette smoking with electronic cigarettes, which are commonly called e-cigarettes. The safety of e-cigarettes is not known, and some may contain harmful chemicals.  If you try to quit but return to smoking, stay positive. It is common to slip up when you first quit, so take it one day at a time.  Be prepared for cravings. When you feel the urge to smoke, chew gum or suck on hard candy. Lifestyle  Stay busy and take care of your body.  Drink enough fluid to keep your urine pale yellow.  Get plenty of exercise and eat a healthy diet. This can help prevent weight gain after quitting.  Monitor your eating habits. Quitting smoking can cause you to have a larger appetite than when you smoke.  Find ways to relax. Go out with friends or family to a movie or a  restaurant where people do not smoke.  Ask your health care provider about having regular tests (screenings) to check for cancer. This may include blood tests, imaging tests, and other tests.  Find ways to manage your stress, such as meditation, yoga, or exercise. Where to find support To get support to quit smoking, consider:  Asking your health care provider for more information and resources.  Taking classes to  learn more about quitting smoking.  Looking for local organizations that offer resources about quitting smoking.  Joining a support group for people who want to quit smoking in your local community.  Calling the smokefree.gov counselor helpline: 1-800-Quit-Now 337 779 8026) Where to find more information You may find more information about quitting smoking from:  HelpGuide.org: www.helpguide.org  https://hall.com/: smokefree.gov  American Lung Association: www.lung.org Contact a health care provider if you:  Have problems breathing.  Notice that your lips, nose, or fingers turn blue.  Have chest pain.  Are coughing up blood.  Feel faint or you pass out.  Have other health changes that cause you to worry. Summary  Smoking tobacco can negatively affect your health, the health of those around you, your finances, and your social life.  Do not start smoking. Quit if you already do. If you need help quitting, ask your health care provider.  Think about joining a support group for people who want to quit smoking in your local community. There are many effective programs that will help you to quit this behavior. This information is not intended to replace advice given to you by your health care provider. Make sure you discuss any questions you have with your health care provider. Document Revised: 10/17/2018 Document Reviewed: 02/07/2016 Elsevier Patient Education  2020 [employer].

## 2019-11-17 NOTE — Assessment & Plan Note (Signed)
Patient was advised to stop drinking completely.

## 2019-11-17 NOTE — Assessment & Plan Note (Signed)
-   The patient's GERD is stable on medication.  - Instructed the patient to avoid eating spicy and acidic foods, as well as foods high in fat. - Instructed the patient to avoid eating large meals or meals 2-3 hours prior to sleeping. 

## 2019-11-17 NOTE — Assessment & Plan Note (Signed)
-   The patient's hyperlipidemia is stable on diet. - The patient will continue the current treatment regimen.  - I encouraged the patient to eat more vegetables and whole wheat, and to avoid fatty foods like whole milk, hard cheese, egg yolks, margarine, baked sweets, and fried foods.  - I encouraged the patient to live an active lifestyle and complete activities for 40 minutes at least three times per week.  - I instructed the patient to go to the ER if they begin having chest pain.

## 2019-11-17 NOTE — Progress Notes (Signed)
Established Patient Office Visit  SUBJECTIVE:  Subjective  Patient ID: Darrell [name], male    DOB: [birth year]  Age: 59 y.o. MRN: [id number]  CC:  Chief Complaint  Patient presents with  . Hypertension    patient has been out of his BP meds for 3 weeks    HPI Darrell [name] is a 59 y.o. male presenting today for a hypertension follow up.  He is taking his medications as directed and without any complication. He denies any missed doses.  His blood pressure today is 169/108; he has been out of his blood pressure medication for the last three weeks. He feels much better overall. He has been working. His head feels much better overall and he denies dizziness or   He continues to drink and continues to smoke.   Past Medical History:  Diagnosis Date  . Cancer ([place])   . Hypertension     Past Surgical History:  Procedure Laterality Date  . LEFT HEART CATH AND CORONARY ANGIOGRAPHY N/A 09/08/2018   Procedure: LEFT HEART CATH AND CORONARY ANGIOGRAPHY;  Surgeon: Corey Skains, MD;  Location: [facility];  Service: Cardiovascular;  Laterality: N/A;  . ROTATOR CUFF REPAIR      Family History  Problem Relation Age of Onset  . Cancer Mother   . Cancer Father   . Hypertension Brother   . Cancer Maternal Aunt   . Cancer Paternal Aunt   . Early death Paternal 32   . Cancer Paternal Grandfather     Social History   Socioeconomic History  . Marital status: Legally Separated    Spouse name: Not on file  . Number of children: Not on file  . Years of education: Not on file  . Highest education level: Not on file  Occupational History  . Not on file  Tobacco Use  . Smoking status: Current Some Day Smoker    Types: Cigars  . Smokeless tobacco: Never Used  . Tobacco comment: 3 per week  Substance and Sexual Activity  . Alcohol use: Yes    Alcohol/week: 3.0 standard drinks    Types: 3 Cans of beer per week    Comment: daily  . Drug use: Not Currently    Types:  Cocaine    Comment: Patient quit 10 yrs ago  . Sexual activity: Yes  Other Topics Concern  . Not on file  Social History Narrative  . Not on file   Social Determinants of Health   Financial Resource Strain:   . Difficulty of Paying Living Expenses: Not on file  Food Insecurity:   . Worried About Charity fundraiser in the Last Year: Not on file  . Ran Out of Food in the Last Year: Not on file  Transportation Needs:   . Lack of Transportation (Medical): Not on file  . Lack of Transportation (Non-Medical): Not on file  Physical Activity:   . Days of Exercise per Week: Not on file  . Minutes of Exercise per Session: Not on file  Stress:   . Feeling of Stress : Not on file  Social Connections:   . Frequency of Communication with Friends and Family: Not on file  . Frequency of Social Gatherings with Friends and Family: Not on file  . Attends Religious Services: Not on file  . Active Member of Clubs or Organizations: Not on file  . Attends Archivist Meetings: Not on file  . Marital Status: Not on file  Intimate Partner  Violence:   . Fear of Current or Ex-Partner: Not on file  . Emotionally Abused: Not on file  . Physically Abused: Not on file  . Sexually Abused: Not on file     Current Outpatient Medications:  .  aspirin EC 81 MG EC tablet, Take 1 tablet (81 mg total) by mouth daily., Disp: 120 tablet, Rfl: 0 .  atorvastatin (LIPITOR) 40 MG tablet, Take 1 tablet (40 mg total) by mouth daily at 6 PM. (Patient taking differently: Take 40 mg by mouth daily. ), Disp: 30 tablet, Rfl: 0 .  meclizine (ANTIVERT) 25 MG tablet, Take 1 tablet (25 mg total) by mouth 3 (three) times daily as needed for dizziness., Disp: 30 tablet, Rfl: 0 .  metoprolol tartrate (LOPRESSOR) 25 MG tablet, Take 1 tablet (25 mg total) by mouth 2 (two) times daily., Disp: 60 tablet, Rfl: 0 .  nicotine (NICODERM CQ) 21 mg/24hr patch, Place 1 patch (21 mg total) onto the skin daily., Disp: 28 patch, Rfl:  0 .  omeprazole (PRILOSEC) 20 MG capsule, Take 1 capsule (20 mg total) by mouth daily., Disp: 30 capsule, Rfl: 0 .  oxyCODONE-acetaminophen (PERCOCET) 5-325 MG tablet, Take 1 tablet by mouth 2 (two) times daily as needed., Disp: 20 tablet, Rfl: 0 .  losartan-hydrochlorothiazide (HYZAAR) 50-12.5 MG tablet, Take 1 tablet by mouth daily., Disp: 30 tablet, Rfl: 6   Allergies  Allergen Reactions  . Penicillins Hives    Did it involve swelling of the face/tongue/throat, SOB, or low BP? No Did it involve sudden or severe rash/hives, skin peeling, or any reaction on the inside of your mouth or nose? No Did you need to seek medical attention at a hospital or doctor's office? No When did it last happen? If all above answers are "NO", may proceed with cephalosporin use.     ROS Review of Systems  Constitutional: Negative.   HENT: Negative.   Eyes: Negative.   Respiratory: Negative.   Cardiovascular: Negative.   Gastrointestinal: Negative.   Endocrine: Negative.   Genitourinary: Negative.   Musculoskeletal: Negative.   Skin: Negative.   Allergic/Immunologic: Negative.   Neurological: Negative.  Negative for dizziness, seizures and numbness.  Hematological: Negative.   Psychiatric/Behavioral: Negative.   All other systems reviewed and are negative.    OBJECTIVE:    Physical Exam Vitals reviewed.  Constitutional:      Appearance: Normal appearance.  HENT:     Mouth/Throat:     Mouth: Mucous membranes are moist.  Eyes:     Pupils: Pupils are equal, round, and reactive to light.  Neck:     Vascular: No carotid bruit.  Cardiovascular:     Rate and Rhythm: Normal rate and regular rhythm.     Pulses: Normal pulses.     Heart sounds: Normal heart sounds.  Pulmonary:     Effort: Pulmonary effort is normal.     Breath sounds: Normal breath sounds.  Abdominal:     General: Bowel sounds are normal.     Palpations: Abdomen is soft. There is no hepatomegaly, splenomegaly or mass.      Tenderness: There is no abdominal tenderness.     Hernia: No hernia is present.  Musculoskeletal:     Cervical back: Neck supple.     Right lower leg: No edema.     Left lower leg: No edema.  Skin:    Findings: No rash.  Neurological:     Mental Status: He is alert and oriented to person, place,  and time.     Motor: No weakness.  Psychiatric:        Mood and Affect: Mood normal.        Behavior: Behavior normal.     BP (!) 169/108   Pulse 82   Ht 5\' 8"  (1.727 m)   Wt 158 lb 9.6 oz (71.9 kg)   BMI 24.12 kg/m  Wt Readings from Last 3 Encounters:  11/17/19 158 lb 9.6 oz (71.9 kg)  11/03/19 156 lb 6.4 oz (70.9 kg)  10/14/19 149 lb 14.4 oz (68 kg)    Health Maintenance Due  Topic Date Due  . Hepatitis C Screening  Never done  . COVID-19 Vaccine (1) Never done  . TETANUS/TDAP  Never done  . COLONOSCOPY  Never done  . INFLUENZA VACCINE  Never done    There are no preventive care reminders to display for this patient.  CBC Latest Ref Rng & Units 09/02/2019 08/27/2019 02/13/2019  WBC 4.0 - 10.5 K/uL 8.3 9.2 10.2  Hemoglobin 13.0 - 17.0 g/dL 13.7 11.9(L) 15.1  Hematocrit 39 - 52 % 39.6 34.5(L) 44.0  Platelets 150 - 400 K/uL 348 311 337   CMP Latest Ref Rng & Units 09/02/2019 08/27/2019 02/13/2019  Glucose 70 - 99 mg/dL 108(H) 93 88  BUN 6 - 20 mg/dL 14 22(H) 26(H)  Creatinine 0.61 - 1.24 mg/dL 0.97 1.12 1.14  Sodium 135 - 145 mmol/L 141 139 139  Potassium 3.5 - 5.1 mmol/L 3.5 3.4(L) 3.8  Chloride 98 - 111 mmol/L 102 106 105  CO2 22 - 32 mmol/L 27 21(L) 20(L)  Calcium 8.9 - 10.3 mg/dL 9.5 9.1 9.5  Total Protein 6.5 - 8.1 g/dL 8.0 7.4 -  Total Bilirubin 0.3 - 1.2 mg/dL 0.9 1.0 -  Alkaline Phos 38 - 126 U/L 81 67 -  AST 15 - 41 U/L 21 24 -  ALT 0 - 44 U/L 21 15 -    No results found for: TSH Lab Results  Component Value Date   ALBUMIN 4.2 09/02/2019   ANIONGAP 12 09/02/2019   No results found for: CHOL, HDL, LDLCALC, CHOLHDL No results found for: TRIG No results  found for: HGBA1C    ASSESSMENT & PLAN:   Problem List Items Addressed This Visit      Cardiovascular and Mediastinum   Essential hypertension    - Today, the patient's blood pressure is not well managed on losartan. - The patient will continue the current treatment regimen.  - I encouraged the patient to eat a low-sodium diet to help control blood pressure. - I encouraged the patient to live an active lifestyle and complete activities that increases heart rate to 85% target heart rate at least 5 times per week for one hour.          Relevant Medications   losartan-hydrochlorothiazide (HYZAAR) 50-12.5 MG tablet     Digestive   Gastroesophageal reflux disease without esophagitis    - The patient's GERD is stable on medication.  - Instructed the patient to avoid eating spicy and acidic foods, as well as foods high in fat. - Instructed the patient to avoid eating large meals or meals 2-3 hours prior to sleeping.       Relevant Medications   omeprazole (PRILOSEC) 20 MG capsule     Other   Tobacco abuse - Primary    - I instructed the patient to stop smoking and provided them with smoking cessation materials.  - I informed the patient that  smoking puts them at increased risk for cancer, COPD, hypertension, and more.  - Informed the patient to seek help if they begin to have trouble breathing, develop chest pain, start to cough up blood, feel faint, or pass out.        Alcohol abuse    Patient was advised to stop drinking completely.      Dyslipidemia    - The patient's hyperlipidemia is stable on diet. - The patient will continue the current treatment regimen.  - I encouraged the patient to eat more vegetables and whole wheat, and to avoid fatty foods like whole milk, hard cheese, egg yolks, margarine, baked sweets, and fried foods.  - I encouraged the patient to live an active lifestyle and complete activities for 40 minutes at least three times per week.  - I instructed  the patient to go to the ER if they begin having chest pain.           Meds ordered this encounter  Medications  . DISCONTD: losartan-hydrochlorothiazide (HYZAAR) 50-12.5 MG tablet    Sig: Take 1 tablet by mouth daily.    Dispense:  30 tablet    Refill:  1  . omeprazole (PRILOSEC) 20 MG capsule    Sig: Take 1 capsule (20 mg total) by mouth daily.    Dispense:  30 capsule    Refill:  0  . losartan-hydrochlorothiazide (HYZAAR) 50-12.5 MG tablet    Sig: Take 1 tablet by mouth daily.    Dispense:  30 tablet    Refill:  6    Follow-up: Return in about 1 month (around 12/18/2019).  Appointment was made for the patient to see me in about a month. Call to stop taking aspirin.  Patient was advised in front of his wife to stop smoking and drinking.   Cletis Athens, MD [facility] [address], [place], [place] [address]   By signing my name below, I, [employer], attest that this documentation has been prepared under the direction and in the presence of Dr. Cletis Athens Electronically Signed: Cletis Athens, MD 11/17/19, 9:57 AM I personally performed the services described in this documentation, which was SCRIBED in my presence. The recorded information has been reviewed and considered accurate. It has been edited as necessary during review. Cletis Athens, MD

## 2019-11-17 NOTE — Assessment & Plan Note (Signed)
-   Today, the patient's blood pressure is not well managed on losartan. - The patient will continue the current treatment regimen.  - I encouraged the patient to eat a low-sodium diet to help control blood pressure. - I encouraged the patient to live an active lifestyle and complete activities that increases heart rate to 85% target heart rate at least 5 times per week for one hour.

## 2019-12-17 ENCOUNTER — Ambulatory Visit: Payer: PRIVATE HEALTH INSURANCE | Admitting: Family Medicine

## 2019-12-21 ENCOUNTER — Ambulatory Visit: Payer: PRIVATE HEALTH INSURANCE | Attending: Oncology

## 2019-12-22 ENCOUNTER — Inpatient Hospital Stay: Payer: PRIVATE HEALTH INSURANCE | Attending: Oncology | Admitting: Oncology

## 2020-01-06 ENCOUNTER — Telehealth: Payer: Self-pay | Admitting: *Deleted

## 2020-01-06 NOTE — Telephone Encounter (Signed)
Left message with patient to reschedule appts in the lung nodule clinic. Instructed to call back to reschedule his missed appts from November.

## 2020-02-09 ENCOUNTER — Encounter: Payer: Self-pay | Admitting: *Deleted

## 2020-02-09 NOTE — Progress Notes (Signed)
Have not received any return phone calls from patient to reschedule missed appts in the lung nodule clinic. Letter mailed to patient to call back to reschedule. Pt will be discharged from clinic at this time unless calls back in the future to reschedule appts.

## 2020-06-01 ENCOUNTER — Other Ambulatory Visit: Payer: Self-pay | Admitting: Internal Medicine

## 2021-12-20 ENCOUNTER — Emergency Department
Admission: EM | Admit: 2021-12-20 | Discharge: 2021-12-20 | Disposition: A | Discharge status: Home / Self Care | Payer: PRIVATE HEALTH INSURANCE | Attending: Emergency Medicine | Admitting: Emergency Medicine

## 2021-12-20 ENCOUNTER — Other Ambulatory Visit: Payer: Self-pay

## 2021-12-20 ENCOUNTER — Emergency Department: Payer: PRIVATE HEALTH INSURANCE

## 2021-12-20 DIAGNOSIS — W1789XA Other fall from one level to another, initial encounter: Secondary | ICD-10-CM | POA: Insufficient documentation

## 2021-12-20 DIAGNOSIS — M25571 Pain in right ankle and joints of right foot: Secondary | ICD-10-CM | POA: Diagnosis present

## 2021-12-20 DIAGNOSIS — Y9339 Activity, other involving climbing, rappelling and jumping off: Secondary | ICD-10-CM | POA: Diagnosis not present

## 2021-12-20 DIAGNOSIS — S82831A Other fracture of upper and lower end of right fibula, initial encounter for closed fracture: Secondary | ICD-10-CM

## 2021-12-20 DIAGNOSIS — S89391A Other physeal fracture of lower end of right fibula, initial encounter for closed fracture: Secondary | ICD-10-CM | POA: Diagnosis not present

## 2021-12-20 MED ORDER — HYDROCODONE-ACETAMINOPHEN 5-325 MG PO TABS: 1.0000 | ORAL_TABLET | ORAL | 0 refills | Status: AC | PRN

## 2021-12-20 MED ORDER — MELOXICAM 15 MG PO TABS: 15.0000 mg | ORAL_TABLET | Freq: Every day | ORAL | 0 refills | Status: AC

## 2021-12-20 NOTE — ED Triage Notes (Signed)
C/O right ankle injury Sunday.  C/O right ankle pain

## 2021-12-20 NOTE — ED Provider Notes (Signed)
[facility] Provider Note  Patient Contact: 3:50 PM (approximate)   History   Ankle Pain   HPI  Darrell [name] is a 61 y.o. male who presents the emergency department complaining of ankle pain.  Patient states that he was on the back of a truck at work, jumped out and landed awkwardly on his right ankle.  This occurred 3 days ago.  He has been walking on same but having increasing pain to the lateral ankle/lateral malleolus.  Patient denies any open wounds.  No history of previous injuries.  No other complaints at this time.     Physical Exam   Triage Vital Signs: ED Triage Vitals  Enc Vitals Group     BP 12/20/21 1357 (!) 168/105     Pulse Rate 12/20/21 1357 88     Resp 12/20/21 1357 16     Temp 12/20/21 1357 98.6 F (37 C)     Temp Source 12/20/21 1357 Oral     SpO2 12/20/21 1357 100 %     Weight 12/20/21 1355 158 lb 8.2 oz (71.9 kg)     Height 12/20/21 1355 '5\' 8"'$  (1.727 m)     Head Circumference --      Peak Flow --      Pain Score 12/20/21 1355 9     Pain Loc --      Pain Edu? --      Excl. in [place]? --     Most recent vital signs: Vitals:   12/20/21 1357  BP: (!) 168/105  Pulse: 88  Resp: 16  Temp: 98.6 F (37 C)  SpO2: 100%     General: Alert and in no acute distress.  Cardiovascular:  Good peripheral perfusion Respiratory: Normal respiratory effort without tachypnea or retractions. Lungs CTAB.  Musculoskeletal: Full range of motion to all extremities.  Visualization of the right ankle when compared to the left reveals edema specifically along the lateral aspect of the ankle over the malleolus.  There is no obvious deformity.  Patient still has good range of motion.  No break in the skin.  Dorsalis pedis pulses sensation intact to the digits Neurologic:  No gross focal neurologic deficits are appreciated.  Skin:   No rash noted Other:   ED Results / Procedures / Treatments   Labs (all labs ordered are listed, but only abnormal  results are displayed) Labs Reviewed - No data to display   EKG     RADIOLOGY  I personally viewed, evaluated, and interpreted these images as part of my medical decision making, as well as reviewing the written report by the radiologist.  ED Provider Interpretation: Distal fibular fracture with an oblique nondisplaced fracture.  DG Ankle Complete Right  Result Date: 12/20/2021 CLINICAL DATA:  Right ankle injury. EXAM: RIGHT ANKLE - COMPLETE 3+ VIEW COMPARISON:  None Available. FINDINGS: There is an acute oblique nondisplaced fracture of the distal fibula with extension to the ankle joint. There is overlying soft tissue swelling. There is no other acute fracture. Ankle alignment is maintained. The ankle mortise appears intact. There is mild Achilles enthesopathy inferior calcaneal spurring. IMPRESSION: Acute oblique nondisplaced fracture of the distal fibula with extension to the ankle joint. Electronically Signed   By: Valetta Mole M.D.   On: 12/20/2021 14:13    PROCEDURES:  Critical Care performed: No  Procedures   MEDICATIONS ORDERED IN ED: Medications - No data to display   IMPRESSION / MDM / [place] / ED  COURSE  I reviewed the triage vital signs and the nursing notes.                              Differential diagnosis includes, but is not limited to, ankle fracture, ankle dislocation, ankle sprain no weight noted  Patient's presentation is most consistent with acute presentation with potential threat to life or bodily function.   Patient's diagnosis is consistent with dental cellulitis at distal fibular fracture.  Patient presents emergency department ankle pain after jumping off of a truck 3 days ago.  Ongoing ankle pain and edema.  Findings on x-ray are consistent with an oblique nondisplaced distal fibular fracture.  Patient is placed in a boot given crutches for ambulation.  A prescription for pain medication and anti-inflammatories.  Follow-up with  podiatry.. Patient is given ED precautions to return to the ED for any worsening or new symptoms.        FINAL CLINICAL IMPRESSION(S) / ED DIAGNOSES   Final diagnoses:  Other closed fracture of distal end of right fibula, initial encounter     Rx / DC Orders   ED Discharge Orders     None        Note:  This document was prepared using Dragon voice recognition software and may include unintentional dictation errors.   Darletta Moll, PA-C 12/20/21 1609    Rada Hay, MD 12/20/21 2216

## 2022-01-08 ENCOUNTER — Ambulatory Visit (INDEPENDENT_AMBULATORY_CARE_PROVIDER_SITE_OTHER): Payer: PRIVATE HEALTH INSURANCE | Admitting: Internal Medicine

## 2022-01-08 ENCOUNTER — Encounter: Payer: Self-pay | Admitting: Internal Medicine

## 2022-01-08 VITALS — BP 183/111 | HR 87 | Temp 98.0°F | Wt 136.8 lb

## 2022-01-08 DIAGNOSIS — K921 Melena: Secondary | ICD-10-CM | POA: Diagnosis not present

## 2022-01-08 DIAGNOSIS — K219 Gastro-esophageal reflux disease without esophagitis: Secondary | ICD-10-CM | POA: Diagnosis not present

## 2022-01-08 DIAGNOSIS — Z720 Tobacco use: Secondary | ICD-10-CM

## 2022-01-08 DIAGNOSIS — I10 Essential (primary) hypertension: Secondary | ICD-10-CM

## 2022-01-08 DIAGNOSIS — F1010 Alcohol abuse, uncomplicated: Secondary | ICD-10-CM | POA: Diagnosis not present

## 2022-01-08 MED ORDER — LOSARTAN POTASSIUM-HCTZ 50-12.5 MG PO TABS: 1.0000 | ORAL_TABLET | Freq: Every day | ORAL | 3 refills | Status: AC

## 2022-01-08 MED ORDER — METOPROLOL TARTRATE 25 MG PO TABS: 25.0000 mg | ORAL_TABLET | Freq: Two times a day (BID) | ORAL | 3 refills | Status: AC

## 2022-01-08 NOTE — Assessment & Plan Note (Signed)
-   The patient's GERD is stable on medication.  - Instructed the patient to avoid eating spicy and acidic foods, as well as foods high in fat. - Instructed the patient to avoid eating large meals or meals 2-3 hours prior to sleeping. 

## 2022-01-08 NOTE — Assessment & Plan Note (Signed)
-   I instructed the patient to stop smoking and provided them with smoking cessation materials.  - I informed the patient that smoking puts them at increased risk for cancer, COPD, hypertension, and more.  - Informed the patient to seek help if they begin to have trouble breathing, develop chest pain, start to cough up blood, feel faint, or pass out.  

## 2022-01-08 NOTE — Progress Notes (Signed)
Established Patient Office Visit  Subjective:  Patient ID: Darrell [name], male    DOB: March 04, 1960  Age: 62 y.o. MRN: [id number]  CC:  Chief Complaint  Patient presents with   broken ankle    Patient broke right ankle 11/30. He has seen ortho and goes back 12/26 for follow up.    Hypertension    Elevated BP 183/111 in office has taken medication today. Denies, SOB, headache, chest pain and dizziness and blurry vision.     Hypertension    Darrell [name] presents for bp check  Past Medical History:  Diagnosis Date   Cancer [facility])    Hypertension     Past Surgical History:  Procedure Laterality Date   LEFT HEART CATH AND CORONARY ANGIOGRAPHY N/A 09/08/2018   Procedure: LEFT HEART CATH AND CORONARY ANGIOGRAPHY;  Surgeon: Corey Skains, MD;  Location: [place] [facility];  Service: Cardiovascular;  Laterality: N/A;   ROTATOR CUFF REPAIR      Family History  Problem Relation Age of Onset   Cancer Mother    Cancer Father    Hypertension Brother    Cancer Maternal Aunt    Cancer Paternal Aunt    Early death Paternal Aunt    Cancer Paternal Grandfather     Social History   Socioeconomic History   Marital status: Legally Separated    Spouse name: Not on file   Number of children: Not on file   Years of education: Not on file   Highest education level: Not on file  Occupational History   Not on file  Tobacco Use   Smoking status: Some Days    Types: Cigars   Smokeless tobacco: Never   Tobacco comments:    3 per week  Substance and Sexual Activity   Alcohol use: Yes    Alcohol/week: 3.0 standard drinks of alcohol    Types: 3 Cans of beer per week    Comment: daily   Drug use: Not Currently    Types: Cocaine    Comment: Patient quit 10 yrs ago   Sexual activity: Yes  Other Topics Concern   Not on file  Social History Narrative   Not on file   Social Determinants of Health   Financial Resource Strain: Not on file  Food Insecurity: Not on file   Transportation Needs: Not on file  Physical Activity: Not on file  Stress: Not on file  Social Connections: Not on file  Intimate Partner Violence: Not on file     Current Outpatient Medications:    aspirin EC 81 MG EC tablet, Take 1 tablet (81 mg total) by mouth daily., Disp: 120 tablet, Rfl: 0   atorvastatin (LIPITOR) 40 MG tablet, Take 1 tablet (40 mg total) by mouth daily at 6 PM. (Patient taking differently: Take 40 mg by mouth daily.), Disp: 30 tablet, Rfl: 0   meclizine (ANTIVERT) 25 MG tablet, Take 1 tablet (25 mg total) by mouth 3 (three) times daily as needed for dizziness., Disp: 30 tablet, Rfl: 0   meloxicam (MOBIC) 15 MG tablet, Take 1 tablet (15 mg total) by mouth daily., Disp: 30 tablet, Rfl: 0   meloxicam (MOBIC) 15 MG tablet, Take 1 tablet by mouth daily., Disp: , Rfl:    nicotine (NICODERM CQ) 21 mg/24hr patch, Place 1 patch (21 mg total) onto the skin daily., Disp: 28 patch, Rfl: 0   traMADol (ULTRAM) 50 MG tablet, Take 1 tablet by mouth every 6 (six) hours as needed., Disp: ,  Rfl:    HYDROcodone-acetaminophen (NORCO/VICODIN) 5-325 MG tablet, Take 1 tablet by mouth every 4 (four) hours as needed for moderate pain. (Patient not taking: Reported on 01/08/2022), Disp: 12 tablet, Rfl: 0   losartan-hydrochlorothiazide (HYZAAR) 50-12.5 MG tablet, Take 1 tablet by mouth daily., Disp: 90 tablet, Rfl: 3   metoprolol tartrate (LOPRESSOR) 25 MG tablet, Take 1 tablet (25 mg total) by mouth 2 (two) times daily., Disp: 180 tablet, Rfl: 3   omeprazole (PRILOSEC) 20 MG capsule, Take 1 capsule (20 mg total) by mouth daily., Disp: 30 capsule, Rfl: 0   oxyCODONE-acetaminophen (PERCOCET) 5-325 MG tablet, Take 1 tablet by mouth 2 (two) times daily as needed. (Patient not taking: Reported on 01/08/2022), Disp: 20 tablet, Rfl: 0   Allergies  Allergen Reactions   Penicillins Hives    Did it involve swelling of the face/tongue/throat, SOB, or low BP? No Did it involve sudden or severe rash/hives,  skin peeling, or any reaction on the inside of your mouth or nose? No Did you need to seek medical attention at a hospital or doctor's office? No When did it last happen?       If all above answers are "NO", may proceed with cephalosporin use.     ROS Review of Systems  Constitutional: Negative.   HENT: Negative.    Eyes: Negative.   Respiratory: Negative.    Cardiovascular: Negative.   Gastrointestinal: Negative.  Negative for diarrhea.  Endocrine: Negative.   Genitourinary: Negative.  Negative for hematuria.  Musculoskeletal:  Positive for arthralgias and joint swelling.       Fracture rt ankle  Skin: Negative.   Allergic/Immunologic: Negative.   Neurological: Negative.  Negative for syncope.  Hematological: Negative.   Psychiatric/Behavioral: Negative.  Negative for agitation and behavioral problems.   All other systems reviewed and are negative.     Objective:    Physical Exam Vitals reviewed.  Constitutional:      Appearance: Normal appearance.  HENT:     Mouth/Throat:     Mouth: Mucous membranes are moist.  Eyes:     Pupils: Pupils are equal, round, and reactive to light.  Neck:     Vascular: No carotid bruit.  Cardiovascular:     Rate and Rhythm: Normal rate and regular rhythm.     Pulses: Normal pulses.     Heart sounds: Normal heart sounds.  Pulmonary:     Effort: Pulmonary effort is normal.     Breath sounds: Normal breath sounds.  Abdominal:     General: Bowel sounds are normal.     Palpations: Abdomen is soft. There is no hepatomegaly, splenomegaly or mass.     Tenderness: There is no abdominal tenderness.     Hernia: No hernia is present.  Musculoskeletal:     Cervical back: Neck supple.     Right lower leg: No edema.     Left lower leg: No edema.  Skin:    Findings: No rash.  Neurological:     Mental Status: He is alert and oriented to person, place, and time.     Motor: No weakness.  Psychiatric:        Mood and Affect: Mood normal.         Behavior: Behavior normal.     BP (!) 183/111   Pulse 87   Temp 98 F (36.7 C) (Temporal)   Wt 136 lb 12.8 oz (62.1 kg)   SpO2 97%   BMI 20.80 kg/m  Wt Readings from Last  3 Encounters:  01/08/22 136 lb 12.8 oz (62.1 kg)  12/20/21 158 lb 8.2 oz (71.9 kg)  11/17/19 158 lb 9.6 oz (71.9 kg)     Health Maintenance Due  Topic Date Due   Hepatitis C Screening  Never done   COLONOSCOPY (Pts 45-56yr Insurance coverage will need to be confirmed)  Never done    There are no preventive care reminders to display for this patient.  No results found for: "TSH" Lab Results  Component Value Date   WBC 8.3 09/02/2019   HGB 13.7 09/02/2019   HCT 39.6 09/02/2019   MCV 95.0 09/02/2019   PLT 348 09/02/2019   Lab Results  Component Value Date   NA 141 09/02/2019   K 3.5 09/02/2019   CO2 27 09/02/2019   GLUCOSE 108 (H) 09/02/2019   BUN 14 09/02/2019   CREATININE 0.97 09/02/2019   BILITOT 0.9 09/02/2019   ALKPHOS 81 09/02/2019   AST 21 09/02/2019   ALT 21 09/02/2019   PROT 8.0 09/02/2019   ALBUMIN 4.2 09/02/2019   CALCIUM 9.5 09/02/2019   ANIONGAP 12 09/02/2019   No results found for: "CHOL" No results found for: "HDL" No results found for: "LDLCALC" No results found for: "TRIG" No results found for: "CHOLHDL" No results found for: "HGBA1C"    Assessment & Plan:   Problem List Items Addressed This Visit       Cardiovascular and Mediastinum   Essential hypertension     Patient denies any chest pain or shortness of breath there is no history of palpitation or paroxysmal nocturnal dyspnea   patient was advised to follow low-salt low-cholesterol diet    ideally I want to keep systolic blood pressure below 130 mmHg, patient was asked to check blood pressure one times a week and give me a report on that.  Patient will be follow-up in 3 months  or earlier as needed, patient will call me back for any change in the cardiovascular symptoms Patient was advised to buy a book from  local bookstore concerning blood pressure and read several chapters  every day.  This will be supplemented by some of the material we will give him from the office.  Patient should also utilize other resources like YouTube and Internet to learn more about the blood pressure and the diet.  Patient is not taking his medicine blood loss of 1 or 2 months he was advised to take his medicine regularly new prescription was sent to the pharmacy.      Relevant Medications   losartan-hydrochlorothiazide (HYZAAR) 50-12.5 MG tablet   metoprolol tartrate (LOPRESSOR) 25 MG tablet     Digestive   Gastroesophageal reflux disease without esophagitis - Primary    - The patient's GERD is stable on medication.  - Instructed the patient to avoid eating spicy and acidic foods, as well as foods high in fat. - Instructed the patient to avoid eating large meals or meals 2-3 hours prior to sleeping.        Other   Tobacco abuse    - I instructed the patient to stop smoking and provided them with smoking cessation materials.  - I informed the patient that smoking puts them at increased risk for cancer, COPD, hypertension, and more.  - Informed the patient to seek help if they begin to have trouble breathing, develop chest pain, start to cough up blood, feel faint, or pass out.      Alcohol abuse    Patient was advised to  stop drinking alcohol, he will be also referred to the GI specialist of the GI bleeding he states that he has colonoscopy before      Other Visit Diagnoses     Blood in stool       Relevant Orders   Ambulatory referral to Gastroenterology     Patient has a fracture of the right ankle being followed up by orthopedics surgery.  Because of fracture he was advised to stay at home for 2 months as recommended by his orthopedic surgeon.  Disability.  Was papers filled  Meds ordered this encounter  Medications   losartan-hydrochlorothiazide (HYZAAR) 50-12.5 MG tablet    Sig: Take 1 tablet by  mouth daily.    Dispense:  90 tablet    Refill:  3   metoprolol tartrate (LOPRESSOR) 25 MG tablet    Sig: Take 1 tablet (25 mg total) by mouth 2 (two) times daily.    Dispense:  180 tablet    Refill:  3    Follow-up: No follow-ups on file.    Cletis Athens, MD

## 2022-01-08 NOTE — Assessment & Plan Note (Signed)
Patient denies any chest pain or shortness of breath there is no history of palpitation or paroxysmal nocturnal dyspnea   patient was advised to follow low-salt low-cholesterol diet    ideally I want to keep systolic blood pressure below 130 mmHg, patient was asked to check blood pressure one times a week and give me a report on that.  Patient will be follow-up in 3 months  or earlier as needed, patient will call me back for any change in the cardiovascular symptoms Patient was advised to buy a book from local bookstore concerning blood pressure and read several chapters  every day.  This will be supplemented by some of the material we will give him from the office.  Patient should also utilize other resources like YouTube and Internet to learn more about the blood pressure and the diet.  Patient is not taking his medicine blood loss of 1 or 2 months he was advised to take his medicine regularly new prescription was sent to the pharmacy.

## 2022-01-08 NOTE — Assessment & Plan Note (Signed)
Patient was advised to stop drinking alcohol, he will be also referred to the GI specialist of the GI bleeding he states that he has colonoscopy before

## 2022-01-23 ENCOUNTER — Ambulatory Visit: Payer: Commercial Managed Care - HMO | Admitting: Internal Medicine

## 2022-01-23 ENCOUNTER — Encounter: Payer: Self-pay | Admitting: Internal Medicine

## 2022-01-23 VITALS — BP 138/98 | HR 69 | Ht 68.0 in | Wt 140.5 lb

## 2022-01-23 DIAGNOSIS — K219 Gastro-esophageal reflux disease without esophagitis: Secondary | ICD-10-CM

## 2022-01-23 DIAGNOSIS — Z720 Tobacco use: Secondary | ICD-10-CM

## 2022-01-23 DIAGNOSIS — I10 Essential (primary) hypertension: Secondary | ICD-10-CM | POA: Diagnosis not present

## 2022-01-23 DIAGNOSIS — E785 Hyperlipidemia, unspecified: Secondary | ICD-10-CM

## 2022-01-23 DIAGNOSIS — F17210 Nicotine dependence, cigarettes, uncomplicated: Secondary | ICD-10-CM | POA: Diagnosis not present

## 2022-01-23 DIAGNOSIS — F1010 Alcohol abuse, uncomplicated: Secondary | ICD-10-CM

## 2022-01-23 NOTE — Assessment & Plan Note (Signed)
-   I instructed the patient to stop smoking and provided them with smoking cessation materials.  - I informed the patient that smoking puts them at increased risk for cancer, COPD, hypertension, and more.  - Informed the patient to seek help if they begin to have trouble breathing, develop chest pain, start to cough up blood, feel faint, or pass out.  

## 2022-01-23 NOTE — Assessment & Plan Note (Signed)
Hypercholesterolemia  I advised the patient to follow Mediterranean diet This diet is rich in fruits vegetables and whole grain, and This diet is also rich in fish and lean meat Patient should also eat a handful of almonds or walnuts daily Recent heart study indicated that average follow-up on this kind of diet reduces the cardiovascular mortality by 50 to 70%== 

## 2022-01-23 NOTE — Assessment & Plan Note (Signed)
Patient denies any chest pain or shortness of breath there is no history of palpitation or paroxysmal nocturnal dyspnea   patient was advised to follow low-salt low-cholesterol diet    ideally I want to keep systolic blood pressure below 130 mmHg, patient was asked to check blood pressure one times a week and give me a report on that.  Patient will be follow-up in 3 months  or earlier as needed, patient will call me back for any change in the cardiovascular symptoms Patient was advised to buy a book from local bookstore concerning blood pressure and read several chapters  every day.  This will be supplemented by some of the material we will give him from the office.  Patient should also utilize other resources like YouTube and Internet to learn more about the blood pressure and the diet.  I told the patient to stop smoking and drinking.

## 2022-01-23 NOTE — Progress Notes (Signed)
Established Patient Office Visit  Subjective:  Patient ID: Darrell [name], male    DOB: 30-Jun-1960  Age: 61 y.o. MRN: [id number]  CC:  Chief Complaint  Patient presents with   Follow-up    2 week fu, doing great, no problems    HPI  Darrell [name] presents for check up  Past Medical History:  Diagnosis Date   Cancer [facility])    Hypertension     Past Surgical History:  Procedure Laterality Date   LEFT HEART CATH AND CORONARY ANGIOGRAPHY N/A 09/08/2018   Procedure: LEFT HEART CATH AND CORONARY ANGIOGRAPHY;  Surgeon: Corey Skains, MD;  Location: [place] [facility];  Service: Cardiovascular;  Laterality: N/A;   ROTATOR CUFF REPAIR      Family History  Problem Relation Age of Onset   Cancer Mother    Cancer Father    Hypertension Brother    Cancer Maternal Aunt    Cancer Paternal Aunt    Early death Paternal Aunt    Cancer Paternal Grandfather     Social History   Socioeconomic History   Marital status: Legally Separated    Spouse name: Not on file   Number of children: Not on file   Years of education: Not on file   Highest education level: Not on file  Occupational History   Not on file  Tobacco Use   Smoking status: Some Days    Types: Cigars   Smokeless tobacco: Never   Tobacco comments:    3 per week  Substance and Sexual Activity   Alcohol use: Yes    Alcohol/week: 3.0 standard drinks of alcohol    Types: 3 Cans of beer per week    Comment: daily   Drug use: Not Currently    Types: Cocaine    Comment: Patient quit 10 yrs ago   Sexual activity: Yes  Other Topics Concern   Not on file  Social History Narrative   Not on file   Social Determinants of Health   Financial Resource Strain: Not on file  Food Insecurity: Not on file  Transportation Needs: Not on file  Physical Activity: Not on file  Stress: Not on file  Social Connections: Not on file  Intimate Partner Violence: Not on file     Current Outpatient Medications:    aspirin EC  81 MG EC tablet, Take 1 tablet (81 mg total) by mouth daily., Disp: 120 tablet, Rfl: 0   atorvastatin (LIPITOR) 40 MG tablet, Take 1 tablet (40 mg total) by mouth daily at 6 PM. (Patient taking differently: Take 40 mg by mouth daily.), Disp: 30 tablet, Rfl: 0   HYDROcodone-acetaminophen (NORCO/VICODIN) 5-325 MG tablet, Take 1 tablet by mouth every 4 (four) hours as needed for moderate pain., Disp: 12 tablet, Rfl: 0   losartan-hydrochlorothiazide (HYZAAR) 50-12.5 MG tablet, Take 1 tablet by mouth daily., Disp: 90 tablet, Rfl: 3   meclizine (ANTIVERT) 25 MG tablet, Take 1 tablet (25 mg total) by mouth 3 (three) times daily as needed for dizziness., Disp: 30 tablet, Rfl: 0   meloxicam (MOBIC) 15 MG tablet, Take 1 tablet (15 mg total) by mouth daily., Disp: 30 tablet, Rfl: 0   meloxicam (MOBIC) 15 MG tablet, Take 1 tablet by mouth daily., Disp: , Rfl:    metoprolol tartrate (LOPRESSOR) 25 MG tablet, Take 1 tablet (25 mg total) by mouth 2 (two) times daily., Disp: 180 tablet, Rfl: 3   nicotine (NICODERM CQ) 21 mg/24hr patch, Place 1 patch (21  mg total) onto the skin daily., Disp: 28 patch, Rfl: 0   oxyCODONE-acetaminophen (PERCOCET) 5-325 MG tablet, Take 1 tablet by mouth 2 (two) times daily as needed., Disp: 20 tablet, Rfl: 0   traMADol (ULTRAM) 50 MG tablet, Take 1 tablet by mouth every 6 (six) hours as needed., Disp: , Rfl:    omeprazole (PRILOSEC) 20 MG capsule, Take 1 capsule (20 mg total) by mouth daily., Disp: 30 capsule, Rfl: 0   Allergies  Allergen Reactions   Penicillins Hives    Did it involve swelling of the face/tongue/throat, SOB, or low BP? No Did it involve sudden or severe rash/hives, skin peeling, or any reaction on the inside of your mouth or nose? No Did you need to seek medical attention at a hospital or doctor's office? No When did it last happen?       If all above answers are "NO", may proceed with cephalosporin use.     ROS Review of Systems  Constitutional: Negative.    HENT: Negative.    Eyes: Negative.   Respiratory: Negative.    Cardiovascular: Negative.   Gastrointestinal: Negative.   Endocrine: Negative.   Genitourinary: Negative.   Musculoskeletal: Negative.   Skin: Negative.   Allergic/Immunologic: Negative.   Neurological: Negative.   Hematological: Negative.   Psychiatric/Behavioral: Negative.    All other systems reviewed and are negative.     Objective:    Physical Exam Vitals reviewed.  Constitutional:      Appearance: Normal appearance.  HENT:     Mouth/Throat:     Mouth: Mucous membranes are moist.  Eyes:     Pupils: Pupils are equal, round, and reactive to light.  Neck:     Vascular: No carotid bruit.  Cardiovascular:     Rate and Rhythm: Normal rate and regular rhythm.     Pulses: Normal pulses.     Heart sounds: Normal heart sounds.  Pulmonary:     Effort: Pulmonary effort is normal.     Breath sounds: Normal breath sounds.  Abdominal:     General: Bowel sounds are normal.     Palpations: Abdomen is soft. There is no hepatomegaly, splenomegaly or mass.     Tenderness: There is no abdominal tenderness.     Hernia: No hernia is present.  Musculoskeletal:     Cervical back: Neck supple.     Right lower leg: No edema.     Left lower leg: No edema.  Skin:    Findings: No rash.  Neurological:     Mental Status: He is alert and oriented to person, place, and time.     Motor: No weakness.  Psychiatric:        Mood and Affect: Mood normal.        Behavior: Behavior normal.    Patient is severely brace BP (!) 138/98   Pulse 69   Ht '5\' 8"'$  (1.727 m)   Wt 140 lb 8 oz (63.7 kg)   SpO2 98%   BMI 21.36 kg/m  Wt Readings from Last 3 Encounters:  01/23/22 140 lb 8 oz (63.7 kg)  01/08/22 136 lb 12.8 oz (62.1 kg)  12/20/21 158 lb 8.2 oz (71.9 kg)     Health Maintenance Due  Topic Date Due   Hepatitis C Screening  Never done   COLONOSCOPY (Pts 45-31yr Insurance coverage will need to be confirmed)  Never done     There are no preventive care reminders to display for this patient.  No results  found for: "TSH" Lab Results  Component Value Date   WBC 8.3 09/02/2019   HGB 13.7 09/02/2019   HCT 39.6 09/02/2019   MCV 95.0 09/02/2019   PLT 348 09/02/2019   Lab Results  Component Value Date   NA 141 09/02/2019   K 3.5 09/02/2019   CO2 27 09/02/2019   GLUCOSE 108 (H) 09/02/2019   BUN 14 09/02/2019   CREATININE 0.97 09/02/2019   BILITOT 0.9 09/02/2019   ALKPHOS 81 09/02/2019   AST 21 09/02/2019   ALT 21 09/02/2019   PROT 8.0 09/02/2019   ALBUMIN 4.2 09/02/2019   CALCIUM 9.5 09/02/2019   ANIONGAP 12 09/02/2019   No results found for: "CHOL" No results found for: "HDL" No results found for: "LDLCALC" No results found for: "TRIG" No results found for: "CHOLHDL" No results found for: "HGBA1C"    Assessment & Plan:   Problem List Items Addressed This Visit       Cardiovascular and Mediastinum   Essential hypertension - Primary     Patient denies any chest pain or shortness of breath there is no history of palpitation or paroxysmal nocturnal dyspnea   patient was advised to follow low-salt low-cholesterol diet    ideally I want to keep systolic blood pressure below 130 mmHg, patient was asked to check blood pressure one times a week and give me a report on that.  Patient will be follow-up in 3 months  or earlier as needed, patient will call me back for any change in the cardiovascular symptoms Patient was advised to buy a book from local bookstore concerning blood pressure and read several chapters  every day.  This will be supplemented by some of the material we will give him from the office.  Patient should also utilize other resources like YouTube and Internet to learn more about the blood pressure and the diet.  I told the patient to stop smoking and drinking.        Digestive   Gastroesophageal reflux disease without esophagitis    Counseling  If a person has gastroesophageal  reflux disease (GERD), food and stomach acid move back up into the esophagus and cause symptoms or problems such as damage to the esophagus.  Anti-reflux measures include: raising the head of the bed, avoiding tight clothing or belts, avoiding eating late at night, not lying down shortly after mealtime, and achieving weight loss.  Avoid ASA, NSAID's, caffeine, alcohol, and tobacco.   OTC Pepcid and/or Tums are often very helpful for as needed use.   However, for persisting chronic or daily symptoms, stronger medications like Omeprazole may be needed.  You may need to avoid foods and drinks such as: ? Coffee and tea (with or without caffeine). ? Drinks that contain alcohol. ? Energy drinks and sports drinks. ? Bubbly (carbonated) drinks or sodas. ? Chocolate and cocoa. ? Peppermint and mint flavorings. ? Garlic and onions. ? Horseradish. ? Spicy and acidic foods. These include peppers, chili powder, curry powder, vinegar, hot sauces, and BBQ sauce. ? Citrus fruit juices and citrus fruits, such as oranges, lemons, and limes. ? Tomato-based foods. These include red sauce, chili, salsa, and pizza with red sauce. ? Fried and fatty foods. These include donuts, french fries, potato chips, and high-fat dressings. ? High-fat meats. These include hot dogs, rib eye steak, sausage, ham, and bacon.         Other   Tobacco abuse    - I instructed the patient to stop smoking and  provided them with smoking cessation materials.  - I informed the patient that smoking puts them at increased risk for cancer, COPD, hypertension, and more.  - Informed the patient to seek help if they begin to have trouble breathing, develop chest pain, start to cough up blood, feel faint, or pass out.      Alcohol abuse    Patient was advised to stop drinking alcohol      Dyslipidemia    Hypercholesterolemia  I advised the patient to follow Mediterranean diet This diet is rich in fruits vegetables and whole  grain, and This diet is also rich in fish and lean meat Patient should also eat a handful of almonds or walnuts daily Recent heart study indicated that average follow-up on this kind of diet reduces the cardiovascular mortality by 50 to 70%==       No orders of the defined types were placed in this encounter.   Follow-up: No follow-ups on file.    Cletis Athens, MD

## 2022-01-23 NOTE — Assessment & Plan Note (Signed)
Patient was advised to stop drinking alcohol

## 2022-01-23 NOTE — Assessment & Plan Note (Signed)
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
## 2022-02-26 IMAGING — CT CT HEAD W/O CM
1 series · 16 of 29 positions shown, 20 images · non-contrast
Comparison: 08/28/2019

CLINICAL DATA: Blurred vision, headache

EXAM:
CT HEAD WITHOUT CONTRAST
TECHNIQUE: Contiguous axial images were obtained from the base of the skull
through the vertex without intravenous contrast.

[Series 2: head wo · axial · 0.41mm/px · z∈[+344,+474]mm · 16 of 29 slices shown, 20 images]
[im 2/29  brain]
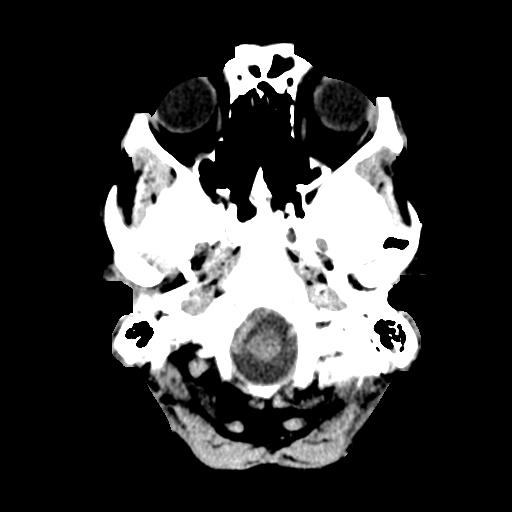
[im 2/29  bone]
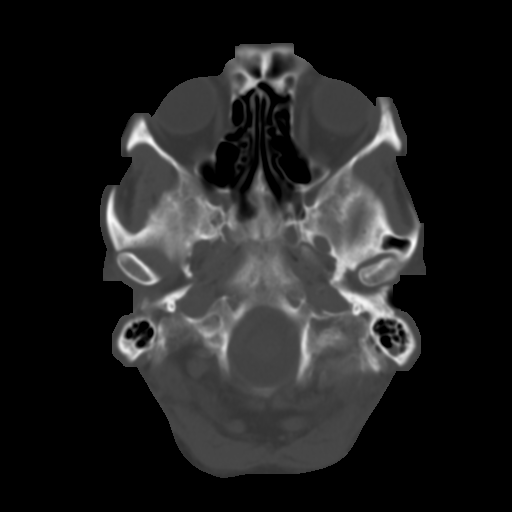
[im 4/29  brain]
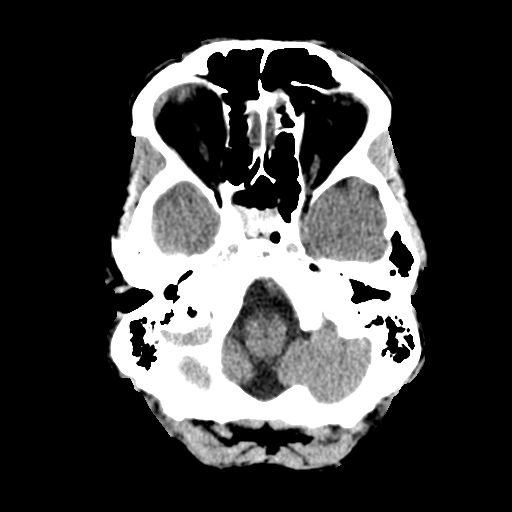
[im 6/29  brain]
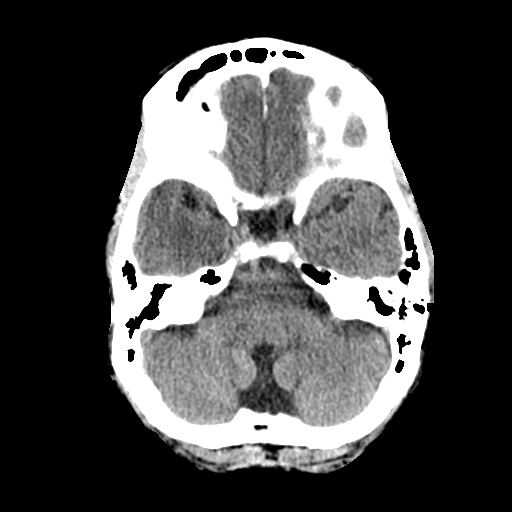
[im 7/29  brain]
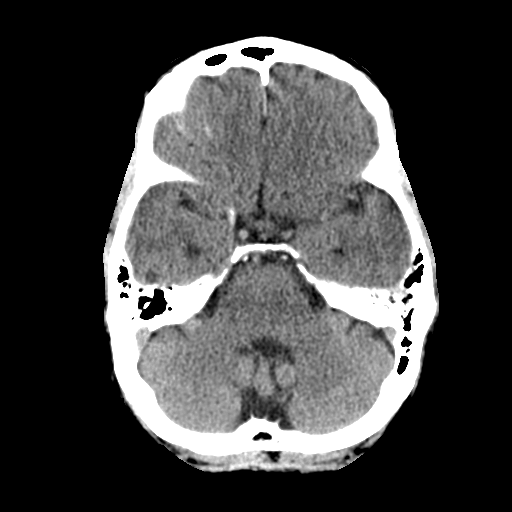
[im 9/29  brain]
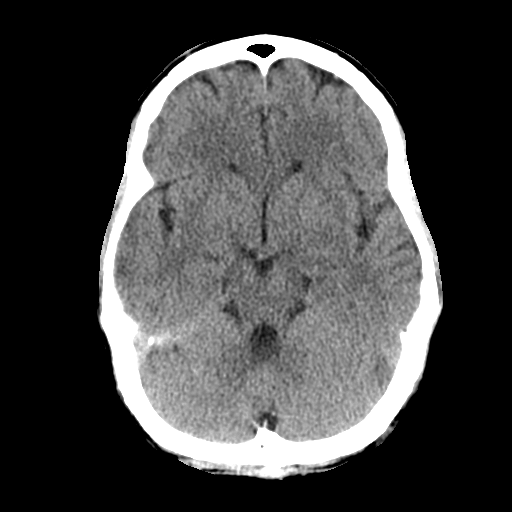
[im 9/29  bone]
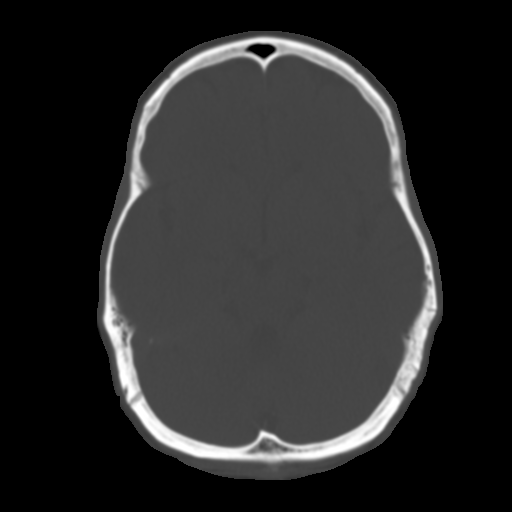
[im 11/29  brain]
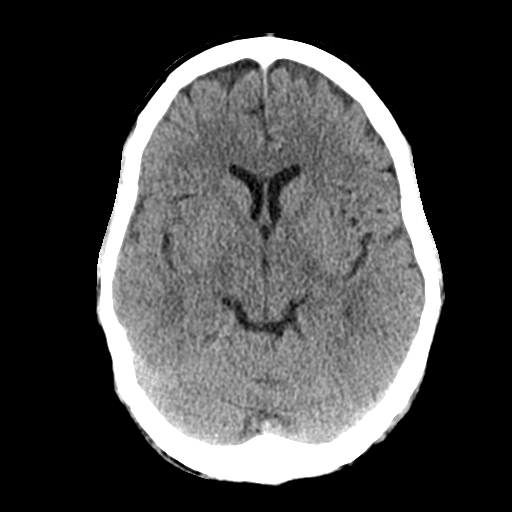
[im 12/29  brain]
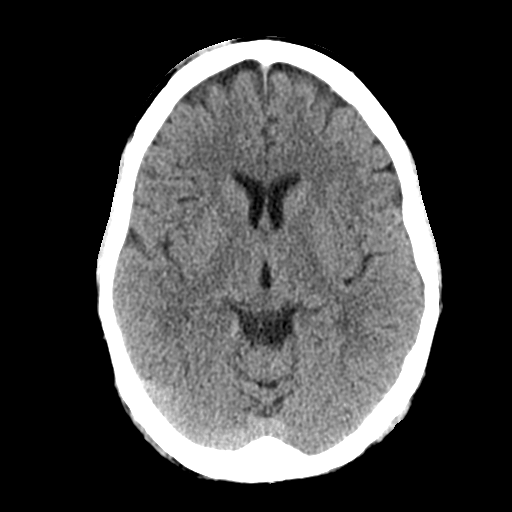
[im 14/29  brain]
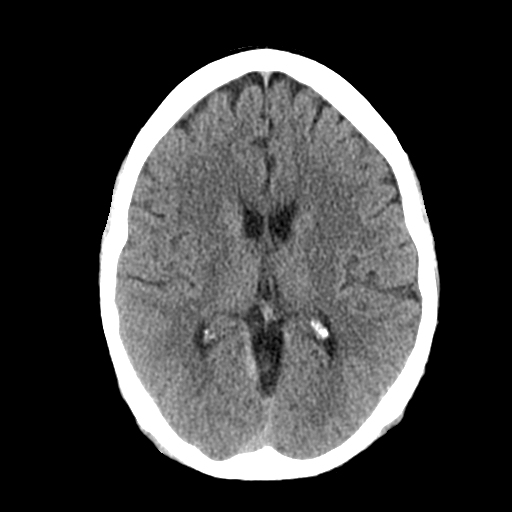
[im 16/29  brain]
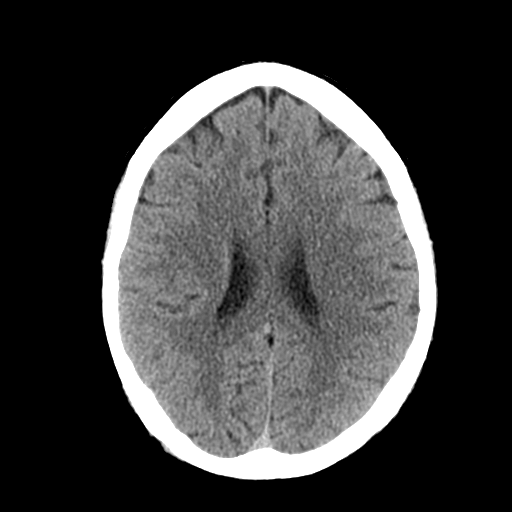
[im 16/29  bone]
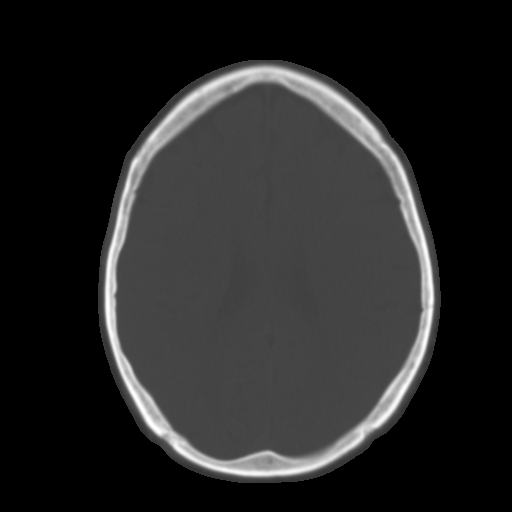
[im 18/29  brain]
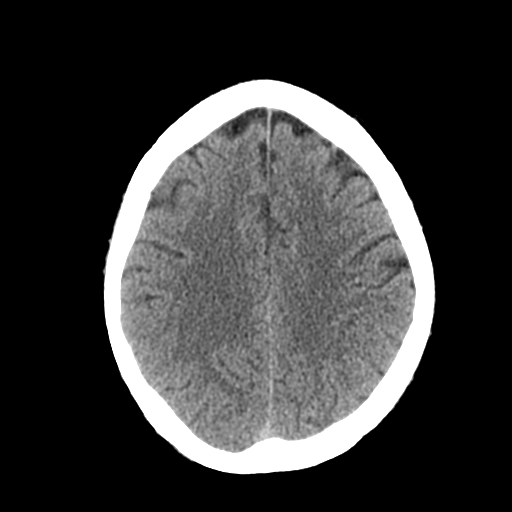
[im 19/29  brain]
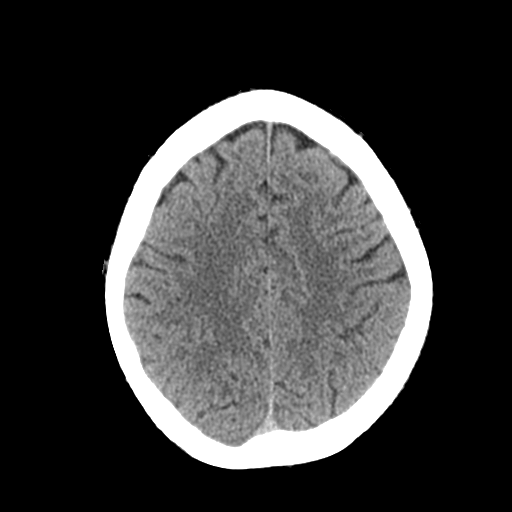
[im 21/29  brain]
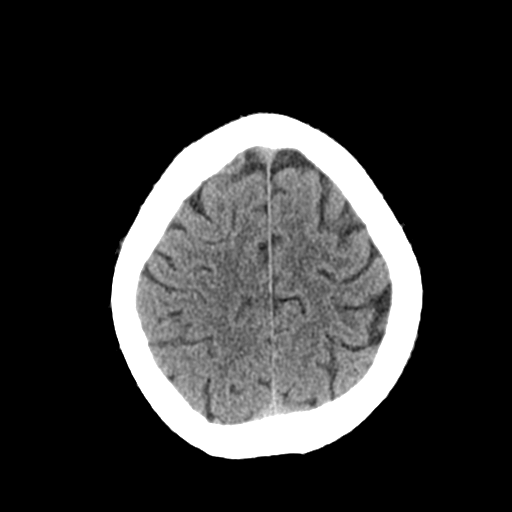
[im 23/29  brain]
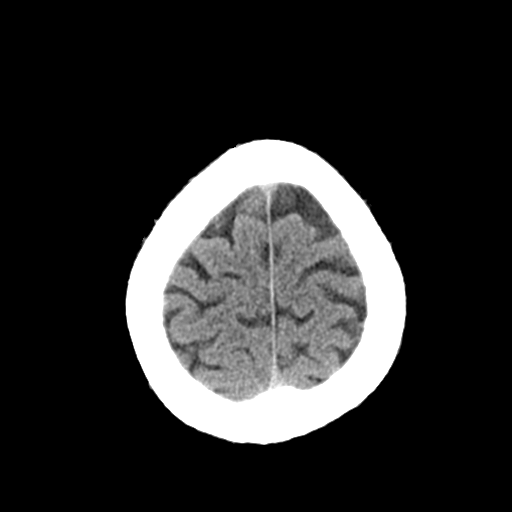
[im 23/29  bone]
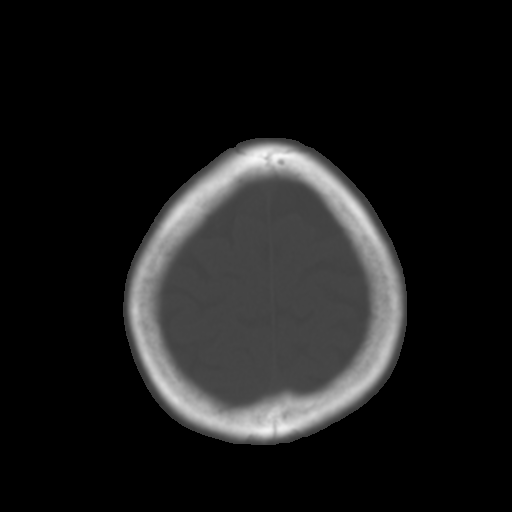
[im 24/29  brain]
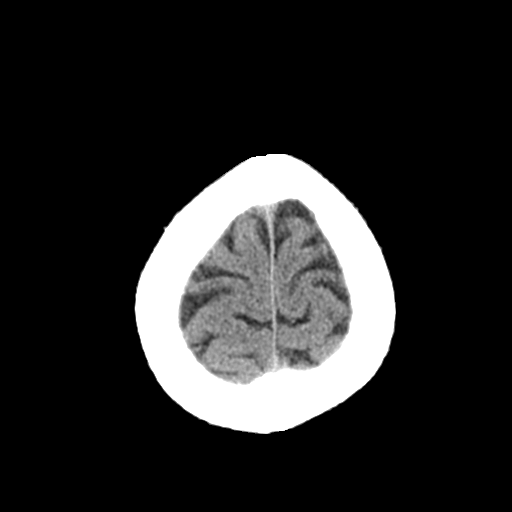
[im 26/29  brain]
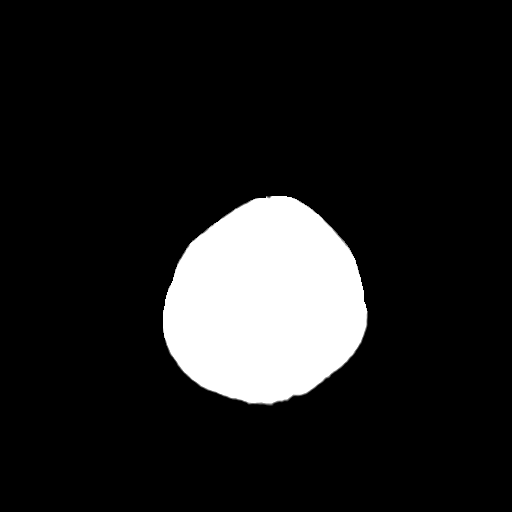
[im 28/29  brain]
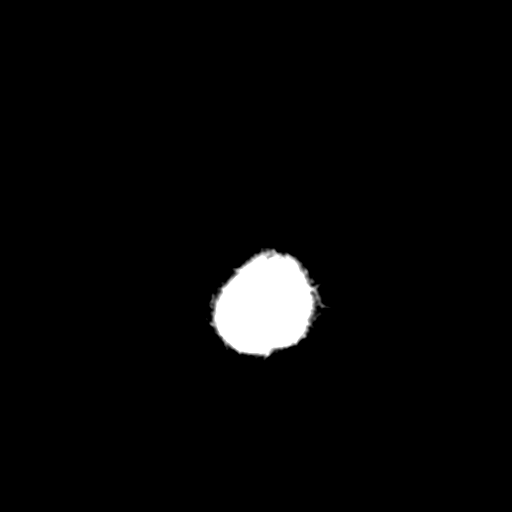

[16 of 29 positions shown; findings below may reference images not displayed]

FINDINGS: Brain: No acute intracranial abnormality. Specifically, no
hemorrhage, hydrocephalus, mass lesion, acute infarction, or
significant intracranial injury.

Vascular: No hyperdense vessel or unexpected calcification.

Skull: Left temporal bone fracture extending into the left parietal
bone again noted, nondisplaced and unchanged.

Sinuses/Orbits: Air-fluid level noted in the right sphenoid sinus,
stable. Fluid in the left mastoid air cells underlying the fracture.

Other: None
IMPRESSION: No acute intracranial abnormality.

Left temporal and parietal bone fracture again noted, unchanged.
Fluid in the right sphenoid sinus and left mastoid air cells
unchanged.

## 2022-02-26 IMAGING — CR DG CERVICAL SPINE FLEX&EXT ONLY
1 series · 2 of 2 positions shown · non-contrast
Comparison: CT same day

CLINICAL DATA: Blurred vision and headache over the last day.

EXAM:
CERVICAL SPINE - FLEXION AND EXTENSION VIEWS ONLY

[Series 1: dg cerv spine flex&ext only · 0.14mm/px · 2 of 2 slices shown]
[im 1/2]
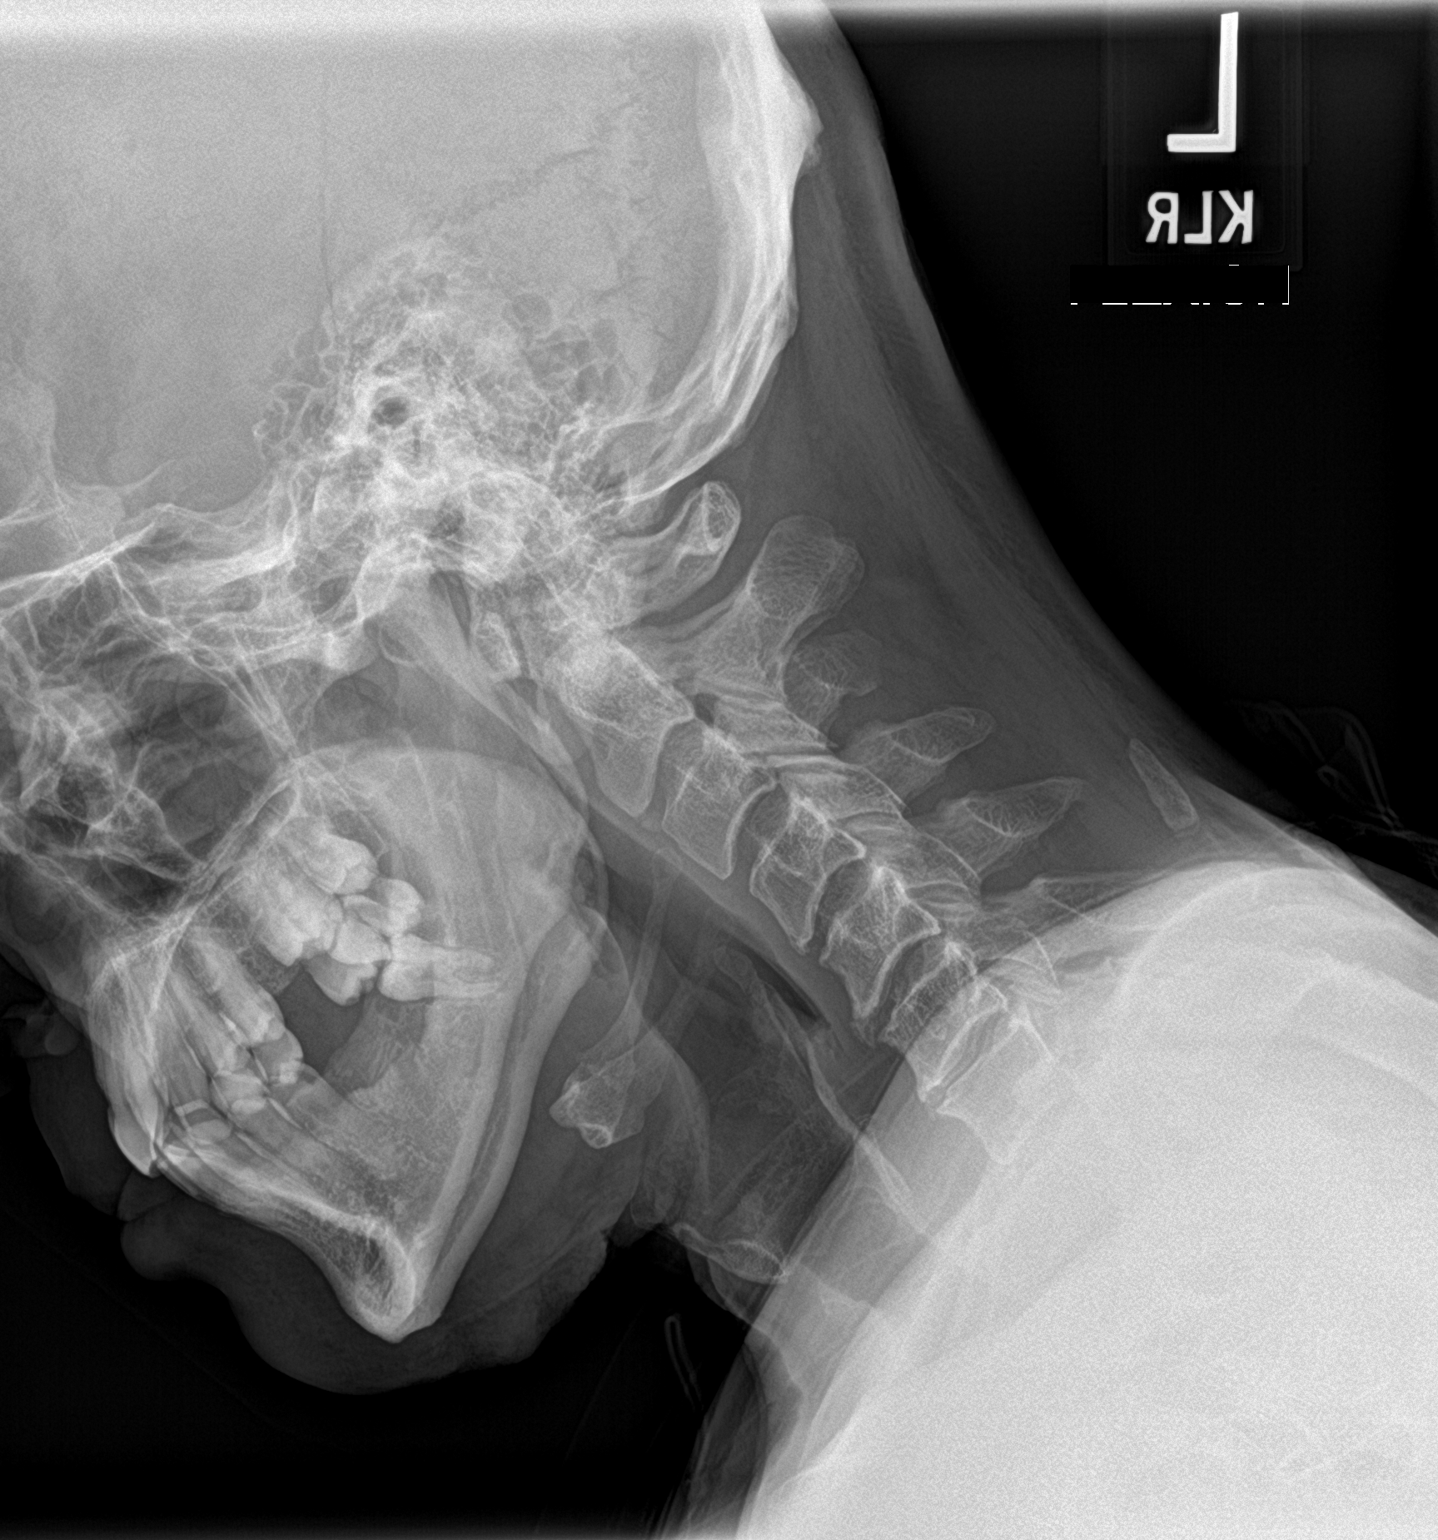
[im 2/2]
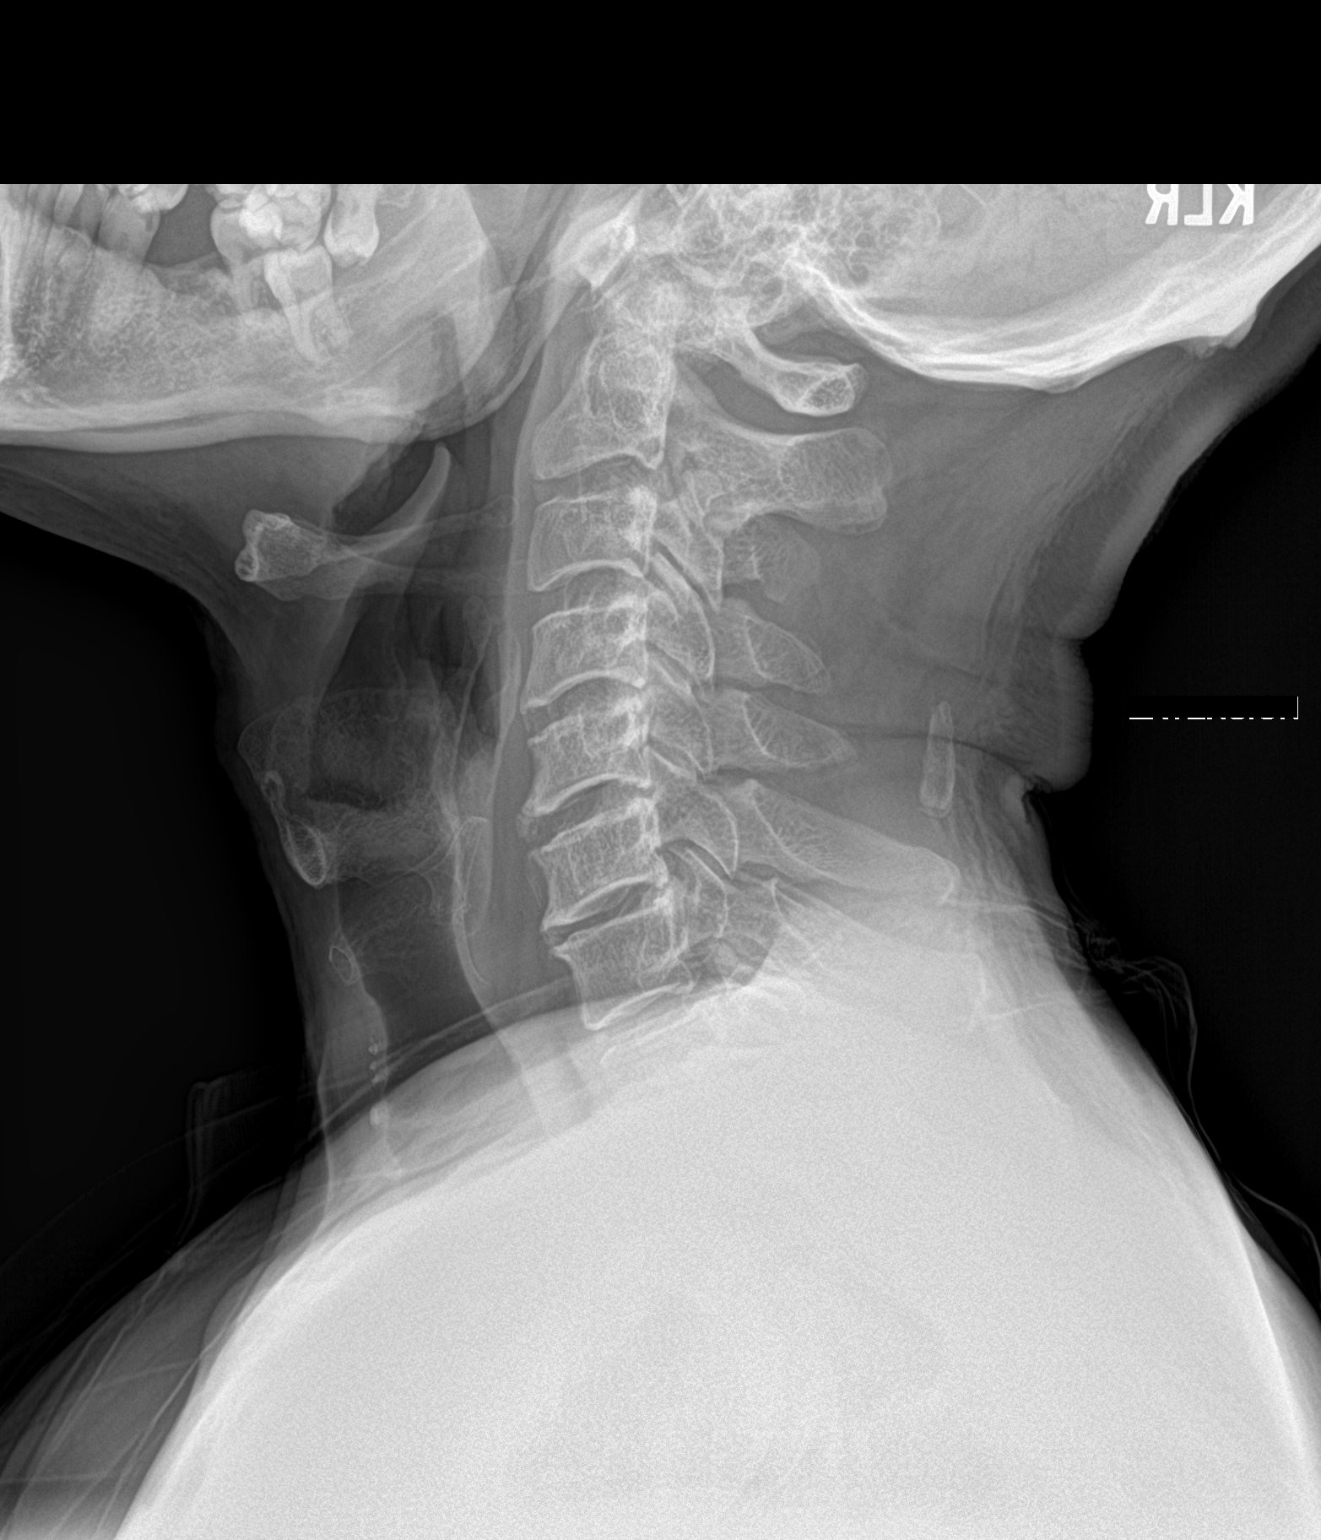

[2 of 2 positions shown; findings below may reference images not displayed]

FINDINGS: Flexion extension views do not show any abnormal motion or
subluxation. No soft tissue swelling. Mild disc space narrowing C5-6
and C6-7.
IMPRESSION: Negative flexion extension evaluation.

## 2022-05-28 ENCOUNTER — Ambulatory Visit: Payer: PRIVATE HEALTH INSURANCE | Admitting: Internal Medicine
